# Patient Record
Sex: Male | Born: 1970 | Race: White | Hispanic: No | Marital: Single | Smoking: Current every day smoker
Health system: Southern US, Community
[De-identification: ages and names within clinical notes are randomized; demographics above are authoritative.]

## PROBLEM LIST (undated history)

## (undated) DIAGNOSIS — I639 Cerebral infarction, unspecified: Secondary | ICD-10-CM

## (undated) DIAGNOSIS — Z5900 Homelessness unspecified: Secondary | ICD-10-CM

---

## 2021-05-03 ENCOUNTER — Other Ambulatory Visit: Payer: Self-pay

## 2021-05-03 ENCOUNTER — Emergency Department (HOSPITAL_COMMUNITY): Payer: Medicare Other

## 2021-05-03 ENCOUNTER — Emergency Department (HOSPITAL_COMMUNITY)
Admission: EM | Admit: 2021-05-03 | Discharge: 2021-05-03 | Disposition: A | Discharge status: Home / Self Care | Payer: Medicare Other | Attending: Emergency Medicine | Admitting: Emergency Medicine

## 2021-05-03 ENCOUNTER — Encounter (HOSPITAL_COMMUNITY): Payer: Self-pay | Admitting: Emergency Medicine

## 2021-05-03 DIAGNOSIS — S80212A Abrasion, left knee, initial encounter: Secondary | ICD-10-CM | POA: Diagnosis not present

## 2021-05-03 DIAGNOSIS — S60512A Abrasion of left hand, initial encounter: Secondary | ICD-10-CM | POA: Diagnosis not present

## 2021-05-03 DIAGNOSIS — X58XXXA Exposure to other specified factors, initial encounter: Secondary | ICD-10-CM | POA: Diagnosis not present

## 2021-05-03 DIAGNOSIS — I10 Essential (primary) hypertension: Secondary | ICD-10-CM | POA: Diagnosis not present

## 2021-05-03 DIAGNOSIS — Z23 Encounter for immunization: Secondary | ICD-10-CM | POA: Insufficient documentation

## 2021-05-03 DIAGNOSIS — S80211A Abrasion, right knee, initial encounter: Secondary | ICD-10-CM | POA: Insufficient documentation

## 2021-05-03 DIAGNOSIS — R569 Unspecified convulsions: Secondary | ICD-10-CM | POA: Diagnosis not present

## 2021-05-03 DIAGNOSIS — S0181XA Laceration without foreign body of other part of head, initial encounter: Secondary | ICD-10-CM | POA: Diagnosis not present

## 2021-05-03 DIAGNOSIS — S0990XA Unspecified injury of head, initial encounter: Secondary | ICD-10-CM | POA: Diagnosis present

## 2021-05-03 HISTORY — DX: Cerebral infarction, unspecified: I63.9

## 2021-05-03 LAB — CBC WITH DIFFERENTIAL/PLATELET
Abs Immature Granulocytes: 0.06 10*3/uL (ref 0.00–0.07)
Basophils Absolute: 0 10*3/uL (ref 0.0–0.1)
Basophils Relative: 0 %
Eosinophils Absolute: 0 10*3/uL (ref 0.0–0.5)
Eosinophils Relative: 0 %
HCT: 43.5 % (ref 39.0–52.0)
Hemoglobin: 15 g/dL (ref 13.0–17.0)
Immature Granulocytes: 1 %
Lymphocytes Relative: 9 %
Lymphs Abs: 1 10*3/uL (ref 0.7–4.0)
MCH: 29.8 pg (ref 26.0–34.0)
MCHC: 34.5 g/dL (ref 30.0–36.0)
MCV: 86.3 fL (ref 80.0–100.0)
Monocytes Absolute: 0.8 10*3/uL (ref 0.1–1.0)
Monocytes Relative: 7 %
Neutro Abs: 9.8 10*3/uL — ABNORMAL HIGH (ref 1.7–7.7)
Neutrophils Relative %: 83 %
Platelets: 240 10*3/uL (ref 150–400)
RBC: 5.04 MIL/uL (ref 4.22–5.81)
RDW: 12.8 % (ref 11.5–15.5)
WBC: 11.7 10*3/uL — ABNORMAL HIGH (ref 4.0–10.5)
nRBC: 0 % (ref 0.0–0.2)

## 2021-05-03 LAB — COMPREHENSIVE METABOLIC PANEL
ALT: 24 U/L (ref 0–44)
AST: 33 U/L (ref 15–41)
Albumin: 4.4 g/dL (ref 3.5–5.0)
Alkaline Phosphatase: 87 U/L (ref 38–126)
Anion gap: 17 — ABNORMAL HIGH (ref 5–15)
BUN: 24 mg/dL — ABNORMAL HIGH (ref 6–20)
CO2: 17 mmol/L — ABNORMAL LOW (ref 22–32)
Calcium: 9.4 mg/dL (ref 8.9–10.3)
Chloride: 104 mmol/L (ref 98–111)
Creatinine, Ser: 1.31 mg/dL — ABNORMAL HIGH (ref 0.61–1.24)
GFR, Estimated: >60 mL/min (ref >60)
Glucose, Bld: 65 mg/dL — ABNORMAL LOW (ref 70–99)
Potassium: 3.6 mmol/L (ref 3.5–5.1)
Sodium: 138 mmol/L (ref 135–145)
Total Bilirubin: 1.8 mg/dL — ABNORMAL HIGH (ref 0.3–1.2)
Total Protein: 7.8 g/dL (ref 6.5–8.1)

## 2021-05-03 LAB — RAPID URINE DRUG SCREEN, HOSP PERFORMED
Amphetamines: NOT DETECTED
Barbiturates: NOT DETECTED
Benzodiazepines: NOT DETECTED
Cocaine: NOT DETECTED
Opiates: NOT DETECTED
Tetrahydrocannabinol: NOT DETECTED

## 2021-05-03 LAB — CBG MONITORING, ED
Glucose-Capillary: 71 mg/dL (ref 70–99)
Glucose-Capillary: 72 mg/dL (ref 70–99)

## 2021-05-03 MED ORDER — DIVALPROEX SODIUM 500 MG PO DR TAB: 500.0000 mg | DELAYED_RELEASE_TABLET | Freq: Two times a day (BID) | ORAL | 2 refills | Status: DC

## 2021-05-03 MED ORDER — LACTATED RINGERS IV BOLUS
1000.0000 mL | Freq: Once | INTRAVENOUS | Status: AC
  Administered 2021-05-03: 1000 mL via INTRAVENOUS

## 2021-05-03 MED ORDER — TETANUS-DIPHTH-ACELL PERTUSSIS 5-2.5-18.5 LF-MCG/0.5 IM SUSY
0.5000 mL | PREFILLED_SYRINGE | Freq: Once | INTRAMUSCULAR | Status: AC
  Administered 2021-05-03: 0.5 mL via INTRAMUSCULAR
  Filled 2021-05-03: qty 0.5

## 2021-05-03 MED ORDER — VALPROATE SODIUM 100 MG/ML IV SOLN: 1000.0000 mg | Freq: Two times a day (BID) | INTRAVENOUS | Status: DC

## 2021-05-03 MED ORDER — DIVALPROEX SODIUM 500 MG PO DR TAB
1000.0000 mg | DELAYED_RELEASE_TABLET | Freq: Once | ORAL | Status: AC
  Administered 2021-05-03: 1000 mg via ORAL
  Filled 2021-05-03: qty 2

## 2021-05-03 NOTE — ED Notes (Signed)
Patient returned back from CT at this time.  °

## 2021-05-03 NOTE — Discharge Instructions (Addendum)
You were seen in the emergency department after having a seizure.  Your work-up in the emergency department showed that you had some low blood sugars, so we allowed you to eat while you were in the emergency department.  We also gave you some more Depakote while you were here as well as fluids and a tetanus shot.  I have prescribed you Depakote, you will take 500 mg twice a day.  I have given you a physical copy of this prescription which she will bring to [facility] wellness.  Their address and contact information is in this packet.  Please return to the ED for any worsening symptoms.

## 2021-05-03 NOTE — ED Provider Notes (Signed)
I saw and evaluated the patient, reviewed the resident's note and I agree with the findings and plan.  50 year old male presents after being found on the ground reportedly have had multiple seizures.  Patient states Depakote has been noncompliant.  Denies any use of alcohol tobacco.  Does have some facial injuries from this.  His neurological exam shows no focal weakness.  Does have a facial abrasion noted.  Will CT head and face.  Check electrolytes and loaded with Depakote   Lorre Nick, MD 05/03/21 [id number]

## 2021-05-03 NOTE — ED Triage Notes (Signed)
Pt BIB EMS, found lying on sidewalk with cold exposure. Reported with multiple seizure activity. Pt states he has seizures three times a day. He is on Depakote but does not have access to medications. Skin tear noted to left eye and knuckle. Denies pain, dizziness, N/V. A&O x4  BP 210/120 HR 90 SpO2 98% RA CBG 70

## 2021-05-03 NOTE — ED Provider Notes (Signed)
[facility]-EMERGENCY DEPT Provider Note   CSN: [id number] Arrival date & time: 05/03/21  0848    History Chief Complaint  Patient presents with   Seizures   Hypertension    Arthur [name] is a 50 y.o. male who presents to the ED today with complaints of seizure and hypertension.  Patient is a somewhat poor historian.  He states that he had a seizure last night in his trailer.  He also reports that "someone from school" found him and called EMS.  He does not remember much from the event and has difficulty recalling details.  He states that he has had seizures for the past few years and was originally put on Depakote.  He has not been on Depakote for the past 1.5 years.  Denies alcohol or other drug use.  Endorses a personal history of a stroke.  No family history endorsed by the patient.    Past Medical History:  Diagnosis Date   Stroke [facility])     There are no problems to display for this patient.   History reviewed. No pertinent surgical history.     History reviewed. No pertinent family history.     Home Medications Prior to Admission medications   Medication Sig Start Date End Date Taking? Authorizing Provider  divalproex (DEPAKOTE) 500 MG DR tablet Take 1 tablet (500 mg total) by mouth 2 (two) times daily. 05/03/21  Yes Andrey Campanile, MD    Allergies    Patient has no known allergies.  Review of Systems   Review of Systems  Constitutional: Negative.   HENT: Negative.    Eyes: Negative.   Respiratory:  Negative for shortness of breath.   Cardiovascular:  Negative for chest pain.  Gastrointestinal: Negative.   Endocrine: Negative.   Genitourinary: Negative.   Musculoskeletal: Negative.   Skin:        Lacerations to the face, left knuckles, bilateral knees  Allergic/Immunologic: Negative.   Neurological:  Positive for seizures.  Hematological: Negative.   Psychiatric/Behavioral: Negative.     Physical Exam Updated Vital  Signs BP (!) 181/107   Pulse 80   Temp 98 F (36.7 C) (Oral)   Resp 20   Ht 6\' 4"  (1.93 m)   Wt 90.7 kg   SpO2 99%   BMI 24.34 kg/m   Physical Exam HENT:     Head: Normocephalic.     Comments: There is a 1 x 1 cm laceration in the left temporal region with overlying dried blood.     Mouth/Throat:     Comments: No tongue lacerations noted, though pt with difficulty following some commands Eyes:     Extraocular Movements: Extraocular movements intact.     Pupils: Pupils are equal, round, and reactive to light.  Cardiovascular:     Rate and Rhythm: Normal rate and regular rhythm.     Heart sounds: No murmur heard.   No friction rub. No gallop.  Pulmonary:     Effort: Pulmonary effort is normal.     Breath sounds: Normal breath sounds. No wheezing, rhonchi or rales.  Abdominal:     General: Abdomen is flat. There is no distension.  Skin:    General: Skin is warm and dry.     Comments: There are several abrasions over the anterior aspect of the patella bilaterally without signs of active bleeding. There are small abrasions over the dorsal aspect of the left hand without signs of bleeding  Neurological:     Mental  Status: He is alert and oriented to person, place, and time.     Comments: There is decreased sensation of V2 and V3 regions on the left, right side is intact. 4/5 strength in LUE. 5/5 strength in RUE. 5/5 strength in BLE.  Psychiatric:        Mood and Affect: Mood normal.        Behavior: Behavior normal.    ED Results / Procedures / Treatments   Labs (all labs ordered are listed, but only abnormal results are displayed) Labs Reviewed  CBC WITH DIFFERENTIAL/PLATELET - Abnormal; Notable for the following components:      Result Value   WBC 11.7 (*)    Neutro Abs 9.8 (*)    All other components within normal limits  COMPREHENSIVE METABOLIC PANEL - Abnormal; Notable for the following components:   CO2 17 (*)    Glucose, Bld 65 (*)    BUN 24 (*)    Creatinine,  Ser 1.31 (*)    Total Bilirubin 1.8 (*)    Anion gap 17 (*)    All other components within normal limits  RAPID URINE DRUG SCREEN, HOSP PERFORMED  CBG MONITORING, ED  CBG MONITORING, ED    EKG None  Radiology CT Head Wo Contrast  Result Date: 05/03/2021 CLINICAL DATA:  Found lying on sidewalk, history of seizures EXAM: CT HEAD WITHOUT CONTRAST CT MAXILLOFACIAL WITHOUT CONTRAST CT CERVICAL SPINE WITHOUT CONTRAST TECHNIQUE: Multidetector CT imaging of the head, cervical spine, and maxillofacial structures were performed using the standard protocol without intravenous contrast. Multiplanar CT image reconstructions of the cervical spine and maxillofacial structures were also generated. COMPARISON:  None. FINDINGS: CT HEAD FINDINGS Brain: No definite evidence of acute infarction, hemorrhage, hydrocephalus, extra-axial collection or mass lesion/mass effect. There are multiple lacunes and hypodensities throughout the basal ganglia and frontal hemispheric deep white matter (series 3, image 20, 17). Vascular: No hyperdense vessel or unexpected calcification. CT FACIAL BONES FINDINGS Skull: Normal. Negative for fracture or focal lesion. Facial bones: Minimally displaced fracture of the left nasal bone (series 8, image 35). No other displaced fractures or dislocations. Sinuses/Orbits: No acute finding. Other: Soft tissue contusion of the left cheek (series 7, image 32) and chin (series 7, image 63). CT CERVICAL SPINE FINDINGS Alignment: Degenerative straightening of the normal cervical lordosis. Skull base and vertebrae: No acute fracture. No primary bone lesion or focal pathologic process. Soft tissues and spinal canal: No prevertebral fluid or swelling. No visible canal hematoma. Disc levels: Moderate disc space height loss and osteophytosis of C5 through C7 with otherwise preserved disc spaces. Upper chest: Negative. Other: None. IMPRESSION: 1. No definite acute intracranial pathology. 2. There are multiple  lacunes and hypodensities throughout the basal ganglia and frontal hemispheric deep white matter. These are consistent with small infarctions, demyelinating disorder perhaps a general differential consideration. MRI may be used to further evaluate if desired. 3. Minimally displaced fracture of the left nasal bone. No other displaced fractures or dislocations of the facial bones. 4. Soft tissue contusion of the left cheek and chin. 5. No fracture or static subluxation of the cervical spine. 6. Moderate disc degenerative disease of C5 through C7 otherwise preserved disc spaces. Electronically Signed   By: Jearld Lesch M.D.   On: 05/03/2021 11:08   CT Cervical Spine Wo Contrast  Result Date: 05/03/2021 CLINICAL DATA:  Found lying on sidewalk, history of seizures EXAM: CT HEAD WITHOUT CONTRAST CT MAXILLOFACIAL WITHOUT CONTRAST CT CERVICAL SPINE WITHOUT CONTRAST TECHNIQUE: Multidetector CT  imaging of the head, cervical spine, and maxillofacial structures were performed using the standard protocol without intravenous contrast. Multiplanar CT image reconstructions of the cervical spine and maxillofacial structures were also generated. COMPARISON:  None. FINDINGS: CT HEAD FINDINGS Brain: No definite evidence of acute infarction, hemorrhage, hydrocephalus, extra-axial collection or mass lesion/mass effect. There are multiple lacunes and hypodensities throughout the basal ganglia and frontal hemispheric deep white matter (series 3, image 20, 17). Vascular: No hyperdense vessel or unexpected calcification. CT FACIAL BONES FINDINGS Skull: Normal. Negative for fracture or focal lesion. Facial bones: Minimally displaced fracture of the left nasal bone (series 8, image 35). No other displaced fractures or dislocations. Sinuses/Orbits: No acute finding. Other: Soft tissue contusion of the left cheek (series 7, image 32) and chin (series 7, image 63). CT CERVICAL SPINE FINDINGS Alignment: Degenerative straightening of the  normal cervical lordosis. Skull base and vertebrae: No acute fracture. No primary bone lesion or focal pathologic process. Soft tissues and spinal canal: No prevertebral fluid or swelling. No visible canal hematoma. Disc levels: Moderate disc space height loss and osteophytosis of C5 through C7 with otherwise preserved disc spaces. Upper chest: Negative. Other: None. IMPRESSION: 1. No definite acute intracranial pathology. 2. There are multiple lacunes and hypodensities throughout the basal ganglia and frontal hemispheric deep white matter. These are consistent with small infarctions, demyelinating disorder perhaps a general differential consideration. MRI may be used to further evaluate if desired. 3. Minimally displaced fracture of the left nasal bone. No other displaced fractures or dislocations of the facial bones. 4. Soft tissue contusion of the left cheek and chin. 5. No fracture or static subluxation of the cervical spine. 6. Moderate disc degenerative disease of C5 through C7 otherwise preserved disc spaces. Electronically Signed   By: Jearld Lesch M.D.   On: 05/03/2021 11:08   CT Maxillofacial Wo Contrast  Result Date: 05/03/2021 CLINICAL DATA:  Found lying on sidewalk, history of seizures EXAM: CT HEAD WITHOUT CONTRAST CT MAXILLOFACIAL WITHOUT CONTRAST CT CERVICAL SPINE WITHOUT CONTRAST TECHNIQUE: Multidetector CT imaging of the head, cervical spine, and maxillofacial structures were performed using the standard protocol without intravenous contrast. Multiplanar CT image reconstructions of the cervical spine and maxillofacial structures were also generated. COMPARISON:  None. FINDINGS: CT HEAD FINDINGS Brain: No definite evidence of acute infarction, hemorrhage, hydrocephalus, extra-axial collection or mass lesion/mass effect. There are multiple lacunes and hypodensities throughout the basal ganglia and frontal hemispheric deep white matter (series 3, image 20, 17). Vascular: No hyperdense vessel or  unexpected calcification. CT FACIAL BONES FINDINGS Skull: Normal. Negative for fracture or focal lesion. Facial bones: Minimally displaced fracture of the left nasal bone (series 8, image 35). No other displaced fractures or dislocations. Sinuses/Orbits: No acute finding. Other: Soft tissue contusion of the left cheek (series 7, image 32) and chin (series 7, image 63). CT CERVICAL SPINE FINDINGS Alignment: Degenerative straightening of the normal cervical lordosis. Skull base and vertebrae: No acute fracture. No primary bone lesion or focal pathologic process. Soft tissues and spinal canal: No prevertebral fluid or swelling. No visible canal hematoma. Disc levels: Moderate disc space height loss and osteophytosis of C5 through C7 with otherwise preserved disc spaces. Upper chest: Negative. Other: None. IMPRESSION: 1. No definite acute intracranial pathology. 2. There are multiple lacunes and hypodensities throughout the basal ganglia and frontal hemispheric deep white matter. These are consistent with small infarctions, demyelinating disorder perhaps a general differential consideration. MRI may be used to further evaluate if desired. 3. Minimally displaced  fracture of the left nasal bone. No other displaced fractures or dislocations of the facial bones. 4. Soft tissue contusion of the left cheek and chin. 5. No fracture or static subluxation of the cervical spine. 6. Moderate disc degenerative disease of C5 through C7 otherwise preserved disc spaces. Electronically Signed   By: Jearld Lesch M.D.   On: 05/03/2021 11:08    Procedures Procedures   Medications Ordered in ED Medications  Tdap (BOOSTRIX) injection 0.5 mL (has no administration in time range)  divalproex (DEPAKOTE) DR tablet 1,000 mg (1,000 mg Oral Given 05/03/21 1006)  lactated ringers bolus 1,000 mL (1,000 mLs Intravenous New Bag/Given 05/03/21 1049)    ED Course  I have reviewed the triage vital signs and the nursing notes.  Pertinent  labs & imaging results that were available during my care of the patient were reviewed by me and considered in my medical decision making (see chart for details).    MDM Rules/Calculators/A&P                         This is a 50 yo M with a past medical history of seizures who is here today after having an event last night in the setting of difficulty accessing his Depakote regimen.  On arrival, pt was afebrile with BP of 207/128. CBG 71 on arrival. Exam is notable for a facial laceration in the temporal region, decreased sensation of V2 and V3 on the left side and 4/5 strength in LUE, though patient states that he has had these neuro findings since he had a stroke 1 year ago. We will obtain labs and CT imaging for workup of possible seizures, differential includes epilepsy, substance-induced, electrolyte derangement, intra-cranial lesion.   10 AM: Ordered CBC, CMP, urine tox, CT head and maxillofacial w/o contrast. Administered 1 g PO depakote.   11 AM: UDS negative, CMP showing glucose of 65. WBC 11.7 but otherwise unremarkable.  CT head, C-spine, maxillofacial show no evidence of acute intracranial pathology but reveal multiple committees and hypodensities throughout the basal ganglia and deep white matter.  With the exception of minimally displaced fracture of left nasal bone, no other fractures or abnormalities.  While in the ED, we have allowed the patient to eat given low glucose levels.  Lacerations were cleaned and dressed, and the patient was given fluid bolus and tetanus shot.  For his seizures, we have prescribed Depakote 500 mg twice a day.  We have placed a referral for community health and wellness for the patient to obtain his prescription.  Patient was stable at discharge.  Final Clinical Impression(s) / ED Diagnoses Final diagnoses:  Seizure [facility])    Rx / DC Orders ED Discharge Orders          Ordered    divalproex (DEPAKOTE) 500 MG DR tablet  2 times daily        05/03/21  1336             Andrey Campanile, MD 05/03/21 1355    Lorre Nick, MD 05/05/21 1120

## 2021-05-03 NOTE — ED Notes (Signed)
Patient transported to CT 

## 2021-05-04 ENCOUNTER — Other Ambulatory Visit: Payer: Self-pay

## 2021-05-04 ENCOUNTER — Emergency Department (HOSPITAL_COMMUNITY): Payer: Medicare Other

## 2021-05-04 ENCOUNTER — Encounter (HOSPITAL_COMMUNITY): Payer: Self-pay | Admitting: Emergency Medicine

## 2021-05-04 ENCOUNTER — Emergency Department (HOSPITAL_COMMUNITY)
Admission: EM | Admit: 2021-05-04 | Discharge: 2021-05-04 | Disposition: A | Discharge status: Home / Self Care | Payer: Medicare Other | Source: Home / Self Care | Attending: Emergency Medicine | Admitting: Emergency Medicine

## 2021-05-04 DIAGNOSIS — S00212A Abrasion of left eyelid and periocular area, initial encounter: Secondary | ICD-10-CM | POA: Insufficient documentation

## 2021-05-04 DIAGNOSIS — M25512 Pain in left shoulder: Secondary | ICD-10-CM | POA: Insufficient documentation

## 2021-05-04 DIAGNOSIS — X58XXXA Exposure to other specified factors, initial encounter: Secondary | ICD-10-CM | POA: Insufficient documentation

## 2021-05-04 DIAGNOSIS — S60512A Abrasion of left hand, initial encounter: Secondary | ICD-10-CM | POA: Insufficient documentation

## 2021-05-04 DIAGNOSIS — R569 Unspecified convulsions: Secondary | ICD-10-CM

## 2021-05-04 DIAGNOSIS — R7309 Other abnormal glucose: Secondary | ICD-10-CM | POA: Insufficient documentation

## 2021-05-04 LAB — COMPREHENSIVE METABOLIC PANEL
ALT: 24 U/L (ref 0–44)
AST: 34 U/L (ref 15–41)
Albumin: 4 g/dL (ref 3.5–5.0)
Alkaline Phosphatase: 72 U/L (ref 38–126)
Anion gap: 13 (ref 5–15)
BUN: 25 mg/dL — ABNORMAL HIGH (ref 6–20)
CO2: 18 mmol/L — ABNORMAL LOW (ref 22–32)
Calcium: 9.2 mg/dL (ref 8.9–10.3)
Chloride: 105 mmol/L (ref 98–111)
Creatinine, Ser: 1.25 mg/dL — ABNORMAL HIGH (ref 0.61–1.24)
GFR, Estimated: >60 mL/min (ref >60)
Glucose, Bld: 83 mg/dL (ref 70–99)
Potassium: 3.5 mmol/L (ref 3.5–5.1)
Sodium: 136 mmol/L (ref 135–145)
Total Bilirubin: 1.6 mg/dL — ABNORMAL HIGH (ref 0.3–1.2)
Total Protein: 6.8 g/dL (ref 6.5–8.1)

## 2021-05-04 LAB — CBC WITH DIFFERENTIAL/PLATELET
Abs Immature Granulocytes: 0.03 10*3/uL (ref 0.00–0.07)
Basophils Absolute: 0 10*3/uL (ref 0.0–0.1)
Basophils Relative: 1 %
Eosinophils Absolute: 0 10*3/uL (ref 0.0–0.5)
Eosinophils Relative: 1 %
HCT: 39.2 % (ref 39.0–52.0)
Hemoglobin: 13.6 g/dL (ref 13.0–17.0)
Immature Granulocytes: 0 %
Lymphocytes Relative: 23 %
Lymphs Abs: 1.8 10*3/uL (ref 0.7–4.0)
MCH: 29.8 pg (ref 26.0–34.0)
MCHC: 34.7 g/dL (ref 30.0–36.0)
MCV: 85.8 fL (ref 80.0–100.0)
Monocytes Absolute: 0.9 10*3/uL (ref 0.1–1.0)
Monocytes Relative: 12 %
Neutro Abs: 5.2 10*3/uL (ref 1.7–7.7)
Neutrophils Relative %: 63 %
Platelets: 212 10*3/uL (ref 150–400)
RBC: 4.57 MIL/uL (ref 4.22–5.81)
RDW: 12.7 % (ref 11.5–15.5)
WBC: 8.1 10*3/uL (ref 4.0–10.5)
nRBC: 0 % (ref 0.0–0.2)

## 2021-05-04 LAB — URINALYSIS, ROUTINE W REFLEX MICROSCOPIC
Bacteria, UA: NONE SEEN
Glucose, UA: NEGATIVE mg/dL
Ketones, ur: >=80 mg/dL — AB
Leukocytes,Ua: NEGATIVE
Nitrite: NEGATIVE
Specific Gravity, Urine: >=1.03 (ref 1.005–1.030)
pH: 6 (ref 5.0–8.0)

## 2021-05-04 LAB — BLOOD GAS, VENOUS
Acid-base deficit: 5.7 mmol/L — ABNORMAL HIGH (ref 0.0–2.0)
Bicarbonate: 18.9 mmol/L — ABNORMAL LOW (ref 20.0–28.0)
FIO2: 21
O2 Saturation: 70.1 %
Patient temperature: 98.6
pCO2, Ven: 36.2 mmHg — ABNORMAL LOW (ref 44.0–60.0)
pH, Ven: 7.338 (ref 7.250–7.430)
pO2, Ven: 40.3 mmHg (ref 32.0–45.0)

## 2021-05-04 LAB — RAPID URINE DRUG SCREEN, HOSP PERFORMED
Amphetamines: NOT DETECTED
Barbiturates: NOT DETECTED
Benzodiazepines: NOT DETECTED
Cocaine: NOT DETECTED
Opiates: NOT DETECTED
Tetrahydrocannabinol: NOT DETECTED

## 2021-05-04 LAB — ETHANOL: Alcohol, Ethyl (B): <10 mg/dL (ref <10)

## 2021-05-04 LAB — VALPROIC ACID LEVEL: Valproic Acid Lvl: 15 ug/mL — ABNORMAL LOW (ref 50.0–100.0)

## 2021-05-04 LAB — CBG MONITORING, ED: Glucose-Capillary: 84 mg/dL (ref 70–99)

## 2021-05-04 MED ORDER — DIVALPROEX SODIUM 500 MG PO DR TAB
500.0000 mg | DELAYED_RELEASE_TABLET | Freq: Once | ORAL | Status: AC
  Administered 2021-05-04: 500 mg via ORAL
  Filled 2021-05-04: qty 1

## 2021-05-04 MED ORDER — DIVALPROEX SODIUM 500 MG PO DR TAB
500.0000 mg | DELAYED_RELEASE_TABLET | Freq: Two times a day (BID) | ORAL | 2 refills | Status: DC
  Filled 2021-05-04: qty 60, 30d supply, fill #0

## 2021-05-04 NOTE — ED Triage Notes (Signed)
Pt BIBA.  Per EMS pt was found in the parking lot of the hospital. EMS reports a bystander called 911 reporting the patient laying in the rain. When EMS got to the scene, fire and [employer] were already on the scene reporting a possible seizure. A&O x4. Endorses left shoulder pain.

## 2021-05-04 NOTE — ED Provider Notes (Signed)
[facility]-EMERGENCY DEPT Provider Note   CSN: [id number] Arrival date & time: 05/04/21  1243     History Chief Complaint  Patient presents with   Seizures    Travares Nelles is a 50 y.o. male.  HPI 50 year old male presents after possible seizure.  History is pretty limited at first as the patient seems to be sleepy and unable to communicate much of a history.  He was apparently endorsing left shoulder pain.  Apparently people found him lying in the rain and EMS got there and they were reporting a possible seizure.  The patient was here yesterday for seizures.  He has a known history of seizures but it does not seem like he is compliant with his meds.  Patient does not know what happened or why he is in the hospital.  He denies current shoulder pain.  Past Medical History:  Diagnosis Date   Stroke [facility])     There are no problems to display for this patient.   History reviewed. No pertinent surgical history.     No family history on file.     Home Medications Prior to Admission medications   Medication Sig Start Date End Date Taking? Authorizing Provider  divalproex (DEPAKOTE) 500 MG DR tablet Take 1 tablet (500 mg total) by mouth 2 (two) times daily. 05/03/21   Andrey Campanile, MD    Allergies    Patient has no known allergies.  Review of Systems   Review of Systems  Constitutional:  Negative for fever.  Cardiovascular:  Negative for chest pain.  Musculoskeletal:  Negative for arthralgias.  Neurological:  Negative for headaches.  All other systems reviewed and are negative.  Physical Exam Updated Vital Signs BP (!) 179/110   Pulse 85   Temp 98 F (36.7 C) (Oral)   Resp 15   Ht 6\' 4"  (1.93 m)   Wt 90.7 kg   SpO2 95%   BMI 24.34 kg/m   Physical Exam Vitals and nursing note reviewed.  Constitutional:      General: He is not in acute distress.    Appearance: He is well-developed. He is not ill-appearing or diaphoretic.   HENT:     Head: Normocephalic.     Comments: Left periorbital abrasion.    Right Ear: External ear normal.     Left Ear: External ear normal.     Nose: Nose normal.  Eyes:     General:        Right eye: No discharge.        Left eye: No discharge.     Pupils: Pupils are equal, round, and reactive to light.  Cardiovascular:     Rate and Rhythm: Normal rate and regular rhythm.     Heart sounds: Normal heart sounds.  Pulmonary:     Effort: Pulmonary effort is normal.     Breath sounds: Normal breath sounds.  Abdominal:     Palpations: Abdomen is soft.     Tenderness: There is no abdominal tenderness.  Musculoskeletal:     Cervical back: Normal range of motion and neck supple.  Skin:    General: Skin is warm and dry.     Comments: Left hand abrasions  Neurological:     Mental Status: He is alert.     Comments: Awake, alert, and now oriented.  Initially he was lethargic but now is awake and alert.  He has equal strength in all 4 extremities.  Psychiatric:  Mood and Affect: Mood is not anxious.    ED Results / Procedures / Treatments   Labs (all labs ordered are listed, but only abnormal results are displayed) Labs Reviewed  CBC WITH DIFFERENTIAL/PLATELET  VALPROIC ACID LEVEL  COMPREHENSIVE METABOLIC PANEL  ETHANOL  URINALYSIS, ROUTINE W REFLEX MICROSCOPIC  RAPID URINE DRUG SCREEN, HOSP PERFORMED  BLOOD GAS, VENOUS  CBG MONITORING, ED    EKG None  Radiology CT Head Wo Contrast  Result Date: 05/03/2021 CLINICAL DATA:  Found lying on sidewalk, history of seizures EXAM: CT HEAD WITHOUT CONTRAST CT MAXILLOFACIAL WITHOUT CONTRAST CT CERVICAL SPINE WITHOUT CONTRAST TECHNIQUE: Multidetector CT imaging of the head, cervical spine, and maxillofacial structures were performed using the standard protocol without intravenous contrast. Multiplanar CT image reconstructions of the cervical spine and maxillofacial structures were also generated. COMPARISON:  None. FINDINGS: CT  HEAD FINDINGS Brain: No definite evidence of acute infarction, hemorrhage, hydrocephalus, extra-axial collection or mass lesion/mass effect. There are multiple lacunes and hypodensities throughout the basal ganglia and frontal hemispheric deep white matter (series 3, image 20, 17). Vascular: No hyperdense vessel or unexpected calcification. CT FACIAL BONES FINDINGS Skull: Normal. Negative for fracture or focal lesion. Facial bones: Minimally displaced fracture of the left nasal bone (series 8, image 35). No other displaced fractures or dislocations. Sinuses/Orbits: No acute finding. Other: Soft tissue contusion of the left cheek (series 7, image 32) and chin (series 7, image 63). CT CERVICAL SPINE FINDINGS Alignment: Degenerative straightening of the normal cervical lordosis. Skull base and vertebrae: No acute fracture. No primary bone lesion or focal pathologic process. Soft tissues and spinal canal: No prevertebral fluid or swelling. No visible canal hematoma. Disc levels: Moderate disc space height loss and osteophytosis of C5 through C7 with otherwise preserved disc spaces. Upper chest: Negative. Other: None. IMPRESSION: 1. No definite acute intracranial pathology. 2. There are multiple lacunes and hypodensities throughout the basal ganglia and frontal hemispheric deep white matter. These are consistent with small infarctions, demyelinating disorder perhaps a general differential consideration. MRI may be used to further evaluate if desired. 3. Minimally displaced fracture of the left nasal bone. No other displaced fractures or dislocations of the facial bones. 4. Soft tissue contusion of the left cheek and chin. 5. No fracture or static subluxation of the cervical spine. 6. Moderate disc degenerative disease of C5 through C7 otherwise preserved disc spaces. Electronically Signed   By: Jearld Lesch M.D.   On: 05/03/2021 11:08   CT Cervical Spine Wo Contrast  Result Date: 05/03/2021 CLINICAL DATA:  Found  lying on sidewalk, history of seizures EXAM: CT HEAD WITHOUT CONTRAST CT MAXILLOFACIAL WITHOUT CONTRAST CT CERVICAL SPINE WITHOUT CONTRAST TECHNIQUE: Multidetector CT imaging of the head, cervical spine, and maxillofacial structures were performed using the standard protocol without intravenous contrast. Multiplanar CT image reconstructions of the cervical spine and maxillofacial structures were also generated. COMPARISON:  None. FINDINGS: CT HEAD FINDINGS Brain: No definite evidence of acute infarction, hemorrhage, hydrocephalus, extra-axial collection or mass lesion/mass effect. There are multiple lacunes and hypodensities throughout the basal ganglia and frontal hemispheric deep white matter (series 3, image 20, 17). Vascular: No hyperdense vessel or unexpected calcification. CT FACIAL BONES FINDINGS Skull: Normal. Negative for fracture or focal lesion. Facial bones: Minimally displaced fracture of the left nasal bone (series 8, image 35). No other displaced fractures or dislocations. Sinuses/Orbits: No acute finding. Other: Soft tissue contusion of the left cheek (series 7, image 32) and chin (series 7, image 63). CT CERVICAL SPINE  FINDINGS Alignment: Degenerative straightening of the normal cervical lordosis. Skull base and vertebrae: No acute fracture. No primary bone lesion or focal pathologic process. Soft tissues and spinal canal: No prevertebral fluid or swelling. No visible canal hematoma. Disc levels: Moderate disc space height loss and osteophytosis of C5 through C7 with otherwise preserved disc spaces. Upper chest: Negative. Other: None. IMPRESSION: 1. No definite acute intracranial pathology. 2. There are multiple lacunes and hypodensities throughout the basal ganglia and frontal hemispheric deep white matter. These are consistent with small infarctions, demyelinating disorder perhaps a general differential consideration. MRI may be used to further evaluate if desired. 3. Minimally displaced fracture  of the left nasal bone. No other displaced fractures or dislocations of the facial bones. 4. Soft tissue contusion of the left cheek and chin. 5. No fracture or static subluxation of the cervical spine. 6. Moderate disc degenerative disease of C5 through C7 otherwise preserved disc spaces. Electronically Signed   By: Jearld Lesch M.D.   On: 05/03/2021 11:08   CT Maxillofacial Wo Contrast  Result Date: 05/03/2021 CLINICAL DATA:  Found lying on sidewalk, history of seizures EXAM: CT HEAD WITHOUT CONTRAST CT MAXILLOFACIAL WITHOUT CONTRAST CT CERVICAL SPINE WITHOUT CONTRAST TECHNIQUE: Multidetector CT imaging of the head, cervical spine, and maxillofacial structures were performed using the standard protocol without intravenous contrast. Multiplanar CT image reconstructions of the cervical spine and maxillofacial structures were also generated. COMPARISON:  None. FINDINGS: CT HEAD FINDINGS Brain: No definite evidence of acute infarction, hemorrhage, hydrocephalus, extra-axial collection or mass lesion/mass effect. There are multiple lacunes and hypodensities throughout the basal ganglia and frontal hemispheric deep white matter (series 3, image 20, 17). Vascular: No hyperdense vessel or unexpected calcification. CT FACIAL BONES FINDINGS Skull: Normal. Negative for fracture or focal lesion. Facial bones: Minimally displaced fracture of the left nasal bone (series 8, image 35). No other displaced fractures or dislocations. Sinuses/Orbits: No acute finding. Other: Soft tissue contusion of the left cheek (series 7, image 32) and chin (series 7, image 63). CT CERVICAL SPINE FINDINGS Alignment: Degenerative straightening of the normal cervical lordosis. Skull base and vertebrae: No acute fracture. No primary bone lesion or focal pathologic process. Soft tissues and spinal canal: No prevertebral fluid or swelling. No visible canal hematoma. Disc levels: Moderate disc space height loss and osteophytosis of C5 through C7  with otherwise preserved disc spaces. Upper chest: Negative. Other: None. IMPRESSION: 1. No definite acute intracranial pathology. 2. There are multiple lacunes and hypodensities throughout the basal ganglia and frontal hemispheric deep white matter. These are consistent with small infarctions, demyelinating disorder perhaps a general differential consideration. MRI may be used to further evaluate if desired. 3. Minimally displaced fracture of the left nasal bone. No other displaced fractures or dislocations of the facial bones. 4. Soft tissue contusion of the left cheek and chin. 5. No fracture or static subluxation of the cervical spine. 6. Moderate disc degenerative disease of C5 through C7 otherwise preserved disc spaces. Electronically Signed   By: Jearld Lesch M.D.   On: 05/03/2021 11:08    Procedures Procedures   Medications Ordered in ED Medications - No data to display  ED Course  I have reviewed the triage vital signs and the nursing notes.  Pertinent labs & imaging results that were available during my care of the patient were reviewed by me and considered in my medical decision making (see chart for details).    MDM Rules/Calculators/A&P  Initially patient was pretty sleepy but after giving him some time and initial work-up, he is now much more awake and alert and able to tell me that he does not know what happened today or how he got to the hospital.  I think he was probably postictal from a seizure again today.  Depakote level is obviously subtherapeutic given he just restarted it yesterday.  We will give some more Depakote and is not sure what happened to the paper prescription so we will send a prescription to the [facility].  Otherwise, he declines repeat head CT, which had initially been ordered for altered mental status but now he is awake and alert and oriented and seemingly stable for discharge. Final Clinical Impression(s) / ED  Diagnoses Final diagnoses:  None    Rx / DC Orders ED Discharge Orders     None        Pricilla Loveless, MD 05/04/21 1654

## 2021-05-04 NOTE — Discharge Instructions (Addendum)
-   According to [place] law, you can not drive unless you are seizure / syncope free for at least 6 months and under physician's care.  °  °- Please maintain precautions. Do not participate in activities where a loss of awareness could harm you or someone else. No swimming alone, no tub bathing, no hot tubs, no driving, no operating motorized vehicles (cars, ATVs, motocycles, etc), lawnmowers, power tools or firearms. No standing at heights, such as rooftops, ladders or stairs. Avoid hot objects such as stoves, heaters, open fires. Wear a helmet when riding a bicycle, scooter, skateboard, etc. and avoid areas of traffic. Set your water heater to 120 degrees or less.  °

## 2021-05-05 ENCOUNTER — Other Ambulatory Visit: Payer: Self-pay

## 2021-05-06 ENCOUNTER — Other Ambulatory Visit: Payer: Self-pay

## 2021-05-06 ENCOUNTER — Emergency Department (HOSPITAL_COMMUNITY): Payer: Medicare Other

## 2021-05-06 ENCOUNTER — Inpatient Hospital Stay (HOSPITAL_COMMUNITY)
Admission: EM | Admit: 2021-05-06 | Discharge: 2021-05-14 | DRG: 100 | Disposition: A | Discharge status: Home / Self Care | Payer: Medicare Other | Attending: Internal Medicine | Admitting: Internal Medicine

## 2021-05-06 ENCOUNTER — Encounter (HOSPITAL_COMMUNITY): Payer: Self-pay | Admitting: Emergency Medicine

## 2021-05-06 DIAGNOSIS — G9341 Metabolic encephalopathy: Secondary | ICD-10-CM | POA: Diagnosis present

## 2021-05-06 DIAGNOSIS — Z6824 Body mass index (BMI) 24.0-24.9, adult: Secondary | ICD-10-CM

## 2021-05-06 DIAGNOSIS — F419 Anxiety disorder, unspecified: Secondary | ICD-10-CM | POA: Diagnosis present

## 2021-05-06 DIAGNOSIS — S022XXA Fracture of nasal bones, initial encounter for closed fracture: Secondary | ICD-10-CM | POA: Diagnosis present

## 2021-05-06 DIAGNOSIS — I10 Essential (primary) hypertension: Secondary | ICD-10-CM | POA: Diagnosis present

## 2021-05-06 DIAGNOSIS — D649 Anemia, unspecified: Secondary | ICD-10-CM | POA: Diagnosis present

## 2021-05-06 DIAGNOSIS — E8721 Acute metabolic acidosis: Secondary | ICD-10-CM | POA: Diagnosis present

## 2021-05-06 DIAGNOSIS — W19XXXA Unspecified fall, initial encounter: Secondary | ICD-10-CM | POA: Diagnosis present

## 2021-05-06 DIAGNOSIS — Z9114 Patient's other noncompliance with medication regimen: Secondary | ICD-10-CM

## 2021-05-06 DIAGNOSIS — S0083XA Contusion of other part of head, initial encounter: Secondary | ICD-10-CM | POA: Diagnosis present

## 2021-05-06 DIAGNOSIS — E0590 Thyrotoxicosis, unspecified without thyrotoxic crisis or storm: Secondary | ICD-10-CM | POA: Diagnosis present

## 2021-05-06 DIAGNOSIS — E876 Hypokalemia: Secondary | ICD-10-CM | POA: Diagnosis present

## 2021-05-06 DIAGNOSIS — R569 Unspecified convulsions: Secondary | ICD-10-CM

## 2021-05-06 DIAGNOSIS — F209 Schizophrenia, unspecified: Secondary | ICD-10-CM | POA: Diagnosis present

## 2021-05-06 DIAGNOSIS — N179 Acute kidney failure, unspecified: Secondary | ICD-10-CM | POA: Diagnosis present

## 2021-05-06 DIAGNOSIS — F17210 Nicotine dependence, cigarettes, uncomplicated: Secondary | ICD-10-CM | POA: Diagnosis present

## 2021-05-06 DIAGNOSIS — R451 Restlessness and agitation: Secondary | ICD-10-CM | POA: Diagnosis present

## 2021-05-06 DIAGNOSIS — E162 Hypoglycemia, unspecified: Secondary | ICD-10-CM | POA: Diagnosis present

## 2021-05-06 DIAGNOSIS — Z5900 Homelessness unspecified: Secondary | ICD-10-CM

## 2021-05-06 DIAGNOSIS — Z20822 Contact with and (suspected) exposure to covid-19: Secondary | ICD-10-CM | POA: Diagnosis present

## 2021-05-06 DIAGNOSIS — E440 Moderate protein-calorie malnutrition: Secondary | ICD-10-CM | POA: Diagnosis present

## 2021-05-06 DIAGNOSIS — Z8673 Personal history of transient ischemic attack (TIA), and cerebral infarction without residual deficits: Secondary | ICD-10-CM

## 2021-05-06 DIAGNOSIS — G40909 Epilepsy, unspecified, not intractable, without status epilepticus: Principal | ICD-10-CM | POA: Diagnosis present

## 2021-05-06 DIAGNOSIS — Z781 Physical restraint status: Secondary | ICD-10-CM

## 2021-05-06 DIAGNOSIS — R4182 Altered mental status, unspecified: Secondary | ICD-10-CM | POA: Diagnosis not present

## 2021-05-06 HISTORY — DX: Homelessness unspecified: Z59.00

## 2021-05-06 LAB — CBG MONITORING, ED
Glucose-Capillary: 65 mg/dL — ABNORMAL LOW (ref 70–99)
Glucose-Capillary: 75 mg/dL (ref 70–99)
Glucose-Capillary: 77 mg/dL (ref 70–99)
Glucose-Capillary: 82 mg/dL (ref 70–99)
Glucose-Capillary: 83 mg/dL (ref 70–99)
Glucose-Capillary: 85 mg/dL (ref 70–99)
Glucose-Capillary: 93 mg/dL (ref 70–99)
Glucose-Capillary: 94 mg/dL (ref 70–99)
Glucose-Capillary: 95 mg/dL (ref 70–99)

## 2021-05-06 LAB — CBC WITH DIFFERENTIAL/PLATELET
Abs Immature Granulocytes: 0.03 10*3/uL (ref 0.00–0.07)
Basophils Absolute: 0 10*3/uL (ref 0.0–0.1)
Basophils Relative: 0 %
Eosinophils Absolute: 0 10*3/uL (ref 0.0–0.5)
Eosinophils Relative: 0 %
HCT: 40.1 % (ref 39.0–52.0)
Hemoglobin: 13.7 g/dL (ref 13.0–17.0)
Immature Granulocytes: 0 %
Lymphocytes Relative: 10 %
Lymphs Abs: 0.8 10*3/uL (ref 0.7–4.0)
MCH: 29.9 pg (ref 26.0–34.0)
MCHC: 34.2 g/dL (ref 30.0–36.0)
MCV: 87.6 fL (ref 80.0–100.0)
Monocytes Absolute: 0.5 10*3/uL (ref 0.1–1.0)
Monocytes Relative: 6 %
Neutro Abs: 6.8 10*3/uL (ref 1.7–7.7)
Neutrophils Relative %: 84 %
Platelets: 225 10*3/uL (ref 150–400)
RBC: 4.58 MIL/uL (ref 4.22–5.81)
RDW: 13.2 % (ref 11.5–15.5)
WBC: 8.1 10*3/uL (ref 4.0–10.5)
nRBC: 0 % (ref 0.0–0.2)

## 2021-05-06 LAB — RESP PANEL BY RT-PCR (FLU A&B, COVID) ARPGX2
Influenza A by PCR: NEGATIVE
Influenza B by PCR: NEGATIVE
SARS Coronavirus 2 by RT PCR: NEGATIVE

## 2021-05-06 LAB — BASIC METABOLIC PANEL
Anion gap: 19 — ABNORMAL HIGH (ref 5–15)
BUN: 23 mg/dL — ABNORMAL HIGH (ref 6–20)
CO2: 15 mmol/L — ABNORMAL LOW (ref 22–32)
Calcium: 9 mg/dL (ref 8.9–10.3)
Chloride: 106 mmol/L (ref 98–111)
Creatinine, Ser: 1.06 mg/dL (ref 0.61–1.24)
GFR, Estimated: >60 mL/min (ref >60)
Glucose, Bld: 73 mg/dL (ref 70–99)
Potassium: 3.8 mmol/L (ref 3.5–5.1)
Sodium: 140 mmol/L (ref 135–145)

## 2021-05-06 MED ORDER — LABETALOL HCL 5 MG/ML IV SOLN
20.0000 mg | Freq: Once | INTRAVENOUS | Status: AC
  Administered 2021-05-06: 20 mg via INTRAVENOUS
  Filled 2021-05-06: qty 4

## 2021-05-06 MED ORDER — DIVALPROEX SODIUM 500 MG PO DR TAB
500.0000 mg | DELAYED_RELEASE_TABLET | Freq: Once | ORAL | Status: AC
  Administered 2021-05-06: 500 mg via ORAL
  Filled 2021-05-06: qty 1

## 2021-05-06 MED ORDER — CHLORHEXIDINE GLUCONATE 0.12% ORAL RINSE (MEDLINE KIT)
15.0000 mL | Freq: Two times a day (BID) | OROMUCOSAL | Status: DC
  Administered 2021-05-06 – 2021-05-14 (×15): 15 mL via OROMUCOSAL

## 2021-05-06 MED ORDER — SODIUM CHLORIDE 0.9 % IV SOLN
75.0000 mL/h | INTRAVENOUS | Status: DC
  Administered 2021-05-06 – 2021-05-11 (×6): 75 mL/h via INTRAVENOUS

## 2021-05-06 MED ORDER — ONDANSETRON HCL 4 MG/2ML IJ SOLN: 4.0000 mg | Freq: Four times a day (QID) | INTRAMUSCULAR | Status: DC | PRN

## 2021-05-06 MED ORDER — ONDANSETRON HCL 4 MG PO TABS: 4.0000 mg | ORAL_TABLET | Freq: Four times a day (QID) | ORAL | Status: DC | PRN

## 2021-05-06 MED ORDER — ACETAMINOPHEN 650 MG RE SUPP: 650.0000 mg | RECTAL | Status: DC | PRN

## 2021-05-06 MED ORDER — ORAL CARE MOUTH RINSE
15.0000 mL | OROMUCOSAL | Status: DC
  Administered 2021-05-06 – 2021-05-12 (×30): 15 mL via OROMUCOSAL

## 2021-05-06 MED ORDER — AMLODIPINE BESYLATE 5 MG PO TABS
5.0000 mg | ORAL_TABLET | Freq: Once | ORAL | Status: AC
  Administered 2021-05-06: 5 mg via ORAL
  Filled 2021-05-06: qty 1

## 2021-05-06 MED ORDER — GLUCOSE 40 % PO GEL
1.0000 | ORAL | Status: DC | PRN
  Administered 2021-05-06: 31 g via ORAL
  Filled 2021-05-06: qty 1.21
  Filled 2021-05-06: qty 1

## 2021-05-06 MED ORDER — SODIUM CHLORIDE 0.9 % IV BOLUS
1000.0000 mL | Freq: Once | INTRAVENOUS | Status: AC
  Administered 2021-05-06: 1000 mL via INTRAVENOUS

## 2021-05-06 MED ORDER — LABETALOL HCL 5 MG/ML IV SOLN
10.0000 mg | INTRAVENOUS | Status: DC | PRN
Start: 2021-05-06 — End: 2021-05-14
  Administered 2021-05-09 – 2021-05-10 (×2): 20 mg via INTRAVENOUS
  Filled 2021-05-06 (×3): qty 4

## 2021-05-06 MED ORDER — DIVALPROEX SODIUM 250 MG PO DR TAB
500.0000 mg | DELAYED_RELEASE_TABLET | Freq: Two times a day (BID) | ORAL | Status: DC
  Administered 2021-05-06 – 2021-05-14 (×16): 500 mg via ORAL
  Filled 2021-05-06 (×4): qty 2
  Filled 2021-05-06: qty 1
  Filled 2021-05-06 (×12): qty 2

## 2021-05-06 MED ORDER — ENOXAPARIN SODIUM 40 MG/0.4ML IJ SOSY
40.0000 mg | PREFILLED_SYRINGE | INTRAMUSCULAR | Status: DC
  Administered 2021-05-06 – 2021-05-13 (×8): 40 mg via SUBCUTANEOUS
  Filled 2021-05-06 (×8): qty 0.4

## 2021-05-06 MED ORDER — ACETAMINOPHEN 325 MG PO TABS
650.0000 mg | ORAL_TABLET | ORAL | Status: DC | PRN
  Administered 2021-05-10 – 2021-05-12 (×4): 650 mg via ORAL
  Filled 2021-05-06 (×4): qty 2

## 2021-05-06 NOTE — H&P (Addendum)
History and Physical    Arthur [name] WER:[id number] DOB: [birth year] DOA: 05/06/2021  PCP: Pcp, No  Patient coming from: Homeless  I have personally briefly reviewed patient's old medical records in [facility]  Chief Complaint: Seizures  HPI: Arthur [name] is a 50 y.o. male with medical history significant of stroke, possibly seizures.  Pt homeless.  Found down by EMS laying in parking lot in rain.  BGL 59.  Denies EtOH or substance abuse other than smoking.  Patient states he had a stroke in the past.  He said he has had unresponsive episodes but never been diagnosed with seizures.  3 ED visits in 3 days with seizures during last 2.  Tried to start him on depakote but never picked this med up.  Having apparent seizures at times in ED.   ED Course: MRI brain neg for acute findings.  Dr. Lynelle Doctor reports he spoke with Dr. Amada Jupiter who wanted pt admitted to [facility] for EEG.  Pt started on Depakote.   Review of Systems: As per HPI, otherwise all review of systems negative.  Past Medical History:  Diagnosis Date   Homeless    Stroke [facility])     History reviewed. No pertinent surgical history.   reports that he has been smoking cigarettes. He does not have any smokeless tobacco history on file. He reports that he does not currently use alcohol. He reports that he does not currently use drugs.  No Known Allergies  Family History  Problem Relation Age of Onset   Seizures Neg Hx      Prior to Admission medications   Medication Sig Start Date End Date Taking? Authorizing Provider  divalproex (DEPAKOTE) 500 MG DR tablet Take 1 tablet (500 mg total) by mouth 2 (two) times daily. Patient not taking: Reported on 05/06/2021 05/04/21   Pricilla Loveless, MD    Physical Exam: Vitals:   05/06/21 1615 05/06/21 1800 05/06/21 1801 05/06/21 1915  BP: (!) 157/113  (!) 183/102 (!) 190/117  Pulse: 75  81 86  Resp: 17  20 17   Temp:  (!) 97 F (36.1 C) (!) 97 F (36.1 C)    TempSrc:   Oral   SpO2: 99%  98% 100%    Constitutional: NAD, calm, comfortable Eyes: Abrasion left orbit ENMT: Mucous membranes are moist. Posterior pharynx clear of any exudate or lesions.Normal dentition.  Neck: normal, supple, no masses, no thyromegaly Respiratory: clear to auscultation bilaterally, no wheezing, no crackles. Normal respiratory effort. No accessory muscle use.  Cardiovascular: Regular rate and rhythm, no murmurs / rubs / gallops. No extremity edema. 2+ pedal pulses. No carotid bruits.  Abdomen: no tenderness, no masses palpated. No hepatosplenomegaly. Bowel sounds positive.  Musculoskeletal: no clubbing / cyanosis. No joint deformity upper and lower extremities. Good ROM, no contractures. Normal muscle tone.  Skin: Abrasions to back of hand Neurologic: CN 2-12 grossly intact. Sensation intact, DTR normal. Strength 5/5 in all 4.  Psychiatric: Delayed affect, somewhat slow to arouse.   Labs on Admission: I have personally reviewed following labs and imaging studies  CBC: Recent Labs  Lab 05/03/21 0922 05/04/21 1448 05/06/21 1100  WBC 11.7* 8.1 8.1  NEUTROABS 9.8* 5.2 6.8  HGB 15.0 13.6 13.7  HCT 43.5 39.2 40.1  MCV 86.3 85.8 87.6  PLT 240 212 225   Basic Metabolic Panel: Recent Labs  Lab 05/03/21 0922 05/04/21 1448 05/06/21 1100  NA 138 136 140  K 3.6 3.5 3.8  CL 104 105 106  CO2  17* 18* 15*  GLUCOSE 65* 83 73  BUN 24* 25* 23*  CREATININE 1.31* 1.25* 1.06  CALCIUM 9.4 9.2 9.0   GFR: Estimated Creatinine Clearance: 103.5 mL/min (by C-G formula based on SCr of 1.06 mg/dL). Liver Function Tests: Recent Labs  Lab 05/03/21 0922 05/04/21 1448  AST 33 34  ALT 24 24  ALKPHOS 87 72  BILITOT 1.8* 1.6*  PROT 7.8 6.8  ALBUMIN 4.4 4.0   No results for input(s): LIPASE, AMYLASE in the last 168 hours. No results for input(s): AMMONIA in the last 168 hours. Coagulation Profile: No results for input(s): INR, PROTIME in the last 168 hours. Cardiac  Enzymes: No results for input(s): CKTOTAL, CKMB, CKMBINDEX, TROPONINI in the last 168 hours. BNP (last 3 results) No results for input(s): PROBNP in the last 8760 hours. HbA1C: No results for input(s): HGBA1C in the last 72 hours. CBG: Recent Labs  Lab 05/06/21 1316 05/06/21 1454 05/06/21 1519 05/06/21 1808 05/06/21 1939  GLUCAP 93 65* 94 82 77   Lipid Profile: No results for input(s): CHOL, HDL, LDLCALC, TRIG, CHOLHDL, LDLDIRECT in the last 72 hours. Thyroid Function Tests: No results for input(s): TSH, T4TOTAL, FREET4, T3FREE, THYROIDAB in the last 72 hours. Anemia Panel: No results for input(s): VITAMINB12, FOLATE, FERRITIN, TIBC, IRON, RETICCTPCT in the last 72 hours. Urine analysis:    Component Value Date/Time   COLORURINE AMBER (A) 05/04/2021 1448   APPEARANCEUR CLEAR 05/04/2021 1448   LABSPEC >=1.030 05/04/2021 1448   PHURINE 6.0 05/04/2021 1448   GLUCOSEU NEGATIVE 05/04/2021 1448   HGBUR TRACE (A) 05/04/2021 1448   BILIRUBINUR MODERATE (A) 05/04/2021 1448   KETONESUR >=80 (A) 05/04/2021 1448   PROTEINUR TRACE (A) 05/04/2021 1448   NITRITE NEGATIVE 05/04/2021 1448   LEUKOCYTESUR NEGATIVE 05/04/2021 1448    Radiological Exams on Admission: DG Orbits  Result Date: 05/06/2021 CLINICAL DATA:  Altered mental status.  Clearing for MRI EXAM: ORBITS - COMPLETE 4+ VIEW COMPARISON:  None. FINDINGS: There is no evidence of metallic foreign body within the orbits. No significant bone abnormality identified. IMPRESSION: No evidence of metallic foreign body within the orbits. Electronically Signed   By: Marlan Palau M.D.   On: 05/06/2021 16:06   DG Chest 1 View  Result Date: 05/06/2021 CLINICAL DATA:  Altered mental status. EXAM: CHEST  1 VIEW COMPARISON:  None. FINDINGS: The heart size and mediastinal contours are within normal limits. Both lungs are clear. The visualized skeletal structures are unremarkable. IMPRESSION: No active disease. Electronically Signed   By: Marlan Palau M.D.   On: 05/06/2021 16:04   CT Head Wo Contrast  Result Date: 05/06/2021 CLINICAL DATA:  Mental status changes EXAM: CT HEAD WITHOUT CONTRAST TECHNIQUE: Contiguous axial images were obtained from the base of the skull through the vertex without intravenous contrast. COMPARISON:  05/03/2021 FINDINGS: Brain: No evidence of acute infarction, hemorrhage, hydrocephalus, extra-axial collection or mass lesion/mass effect. Periventricular white matter low attenuation as can be seen with microvascular disease which is greater than expected for the patient's age. Bilateral basal ganglia lacunar infarcts. Small lacunar infarct in the left thalamus. Vascular: No hyperdense vessel or unexpected calcification. Skull: No osseous abnormality. Sinuses/Orbits: Visualized paranasal sinuses are clear. Visualized mastoid sinuses are clear. Visualized orbits demonstrate no focal abnormality. Other: None IMPRESSION: 1. No acute intracranial pathology. 2. Periventricular white matter low attenuation as can be seen with microvascular disease which is greater than expected for the patient's age. Bilateral basal ganglia lacunar infarcts. Small lacunar infarct in  the left thalamus. Electronically Signed   By: Elige Ko M.D.   On: 05/06/2021 14:03   MR BRAIN WO CONTRAST  Result Date: 05/06/2021 CLINICAL DATA:  Altered mental status, confusion EXAM: MRI HEAD WITHOUT CONTRAST TECHNIQUE: Multiplanar, multiecho pulse sequences of the brain and surrounding structures were obtained without intravenous contrast. COMPARISON:  No prior MRI, correlation is made with CT head 05/06/2021 FINDINGS: Evaluation is somewhat limited by motion artifact. Brain: No restricted diffusion to suggest acute or subacute infarction. No acute hemorrhage, mass, mass effect, or midline shift. Multiple lacunar infarcts in the bilateral thalami, basal ganglia, and corona radiata. Confluent T2 hyperintense signal in the periventricular white matter and pons,  likely the sequela of severe chronic small vessel ischemic disease. Punctate foci of hemosiderin deposition left frontal lobe, right occipital lobe, pons, right thalamus, and left basal ganglia, likely sequela of prior hypertensive microhemorrhages. Vascular: Normal flow voids. Skull and upper cervical spine: Normal marrow signal. Sinuses/Orbits: Negative. Other: None. IMPRESSION: Evaluation is somewhat limited by motion artifact. Within this limitation, no acute intracranial process. Electronically Signed   By: Wiliam Ke M.D.   On: 05/06/2021 18:07   DG Abd 2 Views  Result Date: 05/06/2021 CLINICAL DATA:  Altered Mental status EXAM: ABDOMEN - 2 VIEW COMPARISON:  None. FINDINGS: The bowel gas pattern is normal. There is no evidence of free air. No radio-opaque calculi or other significant radiographic abnormality is seen. IMPRESSION: Negative. Electronically Signed   By: Marlan Palau M.D.   On: 05/06/2021 16:09    EKG: Independently reviewed.  Assessment/Plan Principal Problem:   Seizures (HCC) Active Problems:   HTN (hypertension)    Seizures - MRI neg for acute findings, does show multiple chronic infarcts Admitting to [facility] at neuro request for EEG Started on Depakote HTN - Labetalol PRN  DVT prophylaxis: Lovenox Code Status: Full Family Communication: No family in room Disposition Plan: discharge after seizure work up / cleared by neurology Consults called: Neurology called by EDP Admission status: Place in 79     Dyllan Hughett M. DO [facility]  How to contact the [facility] Attending or Consulting provider 7A - 7P or covering provider during after hours 7P -7A, for this patient?  Check the care team in [facility] and look for a) attending/consulting [facility] provider listed and b) the [facility] team listed Log into [email or web address]  Amion Physician Scheduling and messaging for groups and whole hospitals  On call and physician scheduling software for group practices, residents,  hospitalists and other medical providers for call, clinic, rotation and shift schedules. OnCall [employer] is a hospital-wide system for scheduling doctors and paging doctors on call. EasyPlot is for scientific plotting and data analysis.  [email or web address]  and use [place]'s universal password to access. If you do not have the password, please contact the hospital operator.  Locate the [facility] provider you are looking for under [facility] and page to a number that you can be directly reached. If you still have difficulty reaching the provider, please page the [facility] (Director on Call) for the Hospitalists listed on amion for assistance.  05/06/2021, 7:59 PM

## 2021-05-06 NOTE — ED Notes (Signed)
Patient given hot packs.

## 2021-05-06 NOTE — ED Provider Notes (Signed)
Patient initially seen by Dr. Charm Barges.  Please see his note.  MRI was still pending at the time of shift change.  No acute process noted on MRI.  Suspect patient has had recurrent seizures with his decreased bicarb and elevated anion gap.  Patient's mental status has been improved.  He is now awake and talking.  Considering the several days of the last few days of his altered mental status we will admit him to the hospital for further evaluation including EEG.  Patient will need to be admitted to [facility].  Patient noted to be hypertensive.  We will start him on antihypertensive agents.  I reviewed his vital signs from the previous few days and he has been persistently hypertensive.  We will continue to monitor closely.  We will add on UDS   Linwood Dibbles, MD 05/06/21 [id number]

## 2021-05-06 NOTE — ED Provider Notes (Signed)
[place] DEPT Provider Note   CSN: [id number] Arrival date & time: 05/06/21  1018     History No chief complaint on file.   Arthur [name] is a 50 y.o. male.  Patient has a history of stroke and possibly seizure.  He was found by EMS lying down in a parking lot in the cold rain.  He had no other complaints other than being cold and shivering.  Blood sugar was 59 and given oral glucose.  Patient denies any trauma although does have signs of abrasion to his left orbit.  Admits to tobacco denies any alcohol or drugs.  He is not on any medications for seizure.  Per prior notes he was prescribed Depakote but he did not fill this. Homeless.   The history is provided by the patient and the EMS personnel.  Hypoglycemia Initial blood sugar:  59 Severity:  Mild Onset quality:  Unable to specify Progression:  Unable to specify Chronicity:  New Diabetic status:  Non-diabetic Context: decreased oral intake and diet changes   Relieved by:  Oral glucose Ineffective treatments:  None tried Associated symptoms: seizures (??)   Associated symptoms: no altered mental status, no decreased responsiveness, no shortness of breath, no speech difficulty and no vomiting   Hypertension Pertinent negatives include no chest pain, no abdominal pain and no shortness of breath.      Past Medical History:  Diagnosis Date   Stroke [facility])     There are no problems to display for this patient.   History reviewed. No pertinent surgical history.     History reviewed. No pertinent family history.     Home Medications Prior to Admission medications   Medication Sig Start Date End Date Taking? Authorizing Provider  divalproex (DEPAKOTE) 500 MG DR tablet Take 1 tablet (500 mg total) by mouth 2 (two) times daily. 05/04/21   Sherwood Gambler, MD    Allergies    Patient has no known allergies.  Review of Systems   Review of Systems  Constitutional:  Positive for chills.  Negative for decreased responsiveness and fever.  HENT:  Negative for sore throat.   Eyes:  Negative for visual disturbance.  Respiratory:  Negative for shortness of breath.   Cardiovascular:  Negative for chest pain.  Gastrointestinal:  Negative for abdominal pain and vomiting.  Genitourinary:  Negative for dysuria.  Musculoskeletal:  Negative for neck pain.  Skin:  Negative for rash.  Neurological:  Positive for seizures (??). Negative for speech difficulty.   Physical Exam Updated Vital Signs BP (!) 174/117 (BP Location: Left Arm)   Pulse 87   Temp 97.6 F (36.4 C) (Oral)   Resp 20   SpO2 99%   Physical Exam Vitals and nursing note reviewed.  Constitutional:      General: He is not in acute distress.    Appearance: Normal appearance. He is well-developed.  HENT:     Head: Normocephalic.     Comments: Abrasion left orbit Eyes:     Conjunctiva/sclera: Conjunctivae normal.  Cardiovascular:     Rate and Rhythm: Normal rate and regular rhythm.     Heart sounds: No murmur heard. Pulmonary:     Effort: Pulmonary effort is normal. No respiratory distress.     Breath sounds: Normal breath sounds.  Abdominal:     Palpations: Abdomen is soft.     Tenderness: There is no abdominal tenderness. There is no guarding or rebound.  Musculoskeletal:  General: No swelling or deformity. Normal range of motion.     Cervical back: Neck supple.  Skin:    Capillary Refill: Capillary refill takes less than 2 seconds.     Comments: Patient's skin is cold.  He is shivering.  Neurological:     General: No focal deficit present.     Mental Status: He is alert and oriented to person, place, and time.     Sensory: No sensory deficit.     Motor: No weakness.  Psychiatric:        Mood and Affect: Mood normal.    ED Results / Procedures / Treatments   Labs (all labs ordered are listed, but only abnormal results are displayed) Labs Reviewed  BASIC METABOLIC PANEL - Abnormal; Notable  for the following components:      Result Value   CO2 15 (*)    BUN 23 (*)    Anion gap 19 (*)    All other components within normal limits  CBG MONITORING, ED - Abnormal; Notable for the following components:   Glucose-Capillary 65 (*)    All other components within normal limits  RESP PANEL BY RT-PCR (FLU A&B, COVID) ARPGX2  CBC WITH DIFFERENTIAL/PLATELET  CBG MONITORING, ED  CBG MONITORING, ED  CBG MONITORING, ED  CBG MONITORING, ED  CBG MONITORING, ED    EKG EKG Interpretation  Date/Time:  Friday May 06 2021 11:40:40 EST Ventricular Rate:  84 PR Interval:    QRS Duration: 113 QT Interval:  426 QTC Calculation: 504 R Axis:   60 Text Interpretation: Sinus rhythm with artifact Probable left ventricular hypertrophy Prolonged QT interval Artifact in lead(s) I II III aVR aVL aVF V1 V2 Poor data quality Confirmed by Meridee ScoreButler, Amauria Younts [phone]) on 05/06/2021 11:46:30 AM  Radiology DG Orbits  Result Date: 05/06/2021 CLINICAL DATA:  Altered mental status.  Clearing for MRI EXAM: ORBITS - COMPLETE 4+ VIEW COMPARISON:  None. FINDINGS: There is no evidence of metallic foreign body within the orbits. No significant bone abnormality identified. IMPRESSION: No evidence of metallic foreign body within the orbits. Electronically Signed   By: Marlan Palauharles  Clark M.D.   On: 05/06/2021 16:06   DG Chest 1 View  Result Date: 05/06/2021 CLINICAL DATA:  Altered mental status. EXAM: CHEST  1 VIEW COMPARISON:  None. FINDINGS: The heart size and mediastinal contours are within normal limits. Both lungs are clear. The visualized skeletal structures are unremarkable. IMPRESSION: No active disease. Electronically Signed   By: Marlan Palauharles  Clark M.D.   On: 05/06/2021 16:04   CT Head Wo Contrast  Result Date: 05/06/2021 CLINICAL DATA:  Mental status changes EXAM: CT HEAD WITHOUT CONTRAST TECHNIQUE: Contiguous axial images were obtained from the base of the skull through the vertex without intravenous contrast.  COMPARISON:  05/03/2021 FINDINGS: Brain: No evidence of acute infarction, hemorrhage, hydrocephalus, extra-axial collection or mass lesion/mass effect. Periventricular white matter low attenuation as can be seen with microvascular disease which is greater than expected for the patient's age. Bilateral basal ganglia lacunar infarcts. Small lacunar infarct in the left thalamus. Vascular: No hyperdense vessel or unexpected calcification. Skull: No osseous abnormality. Sinuses/Orbits: Visualized paranasal sinuses are clear. Visualized mastoid sinuses are clear. Visualized orbits demonstrate no focal abnormality. Other: None IMPRESSION: 1. No acute intracranial pathology. 2. Periventricular white matter low attenuation as can be seen with microvascular disease which is greater than expected for the patient's age. Bilateral basal ganglia lacunar infarcts. Small lacunar infarct in the left thalamus. Electronically Signed  By: Elige Ko M.D.   On: 05/06/2021 14:03   MR BRAIN WO CONTRAST  Result Date: 05/06/2021 CLINICAL DATA:  Altered mental status, confusion EXAM: MRI HEAD WITHOUT CONTRAST TECHNIQUE: Multiplanar, multiecho pulse sequences of the brain and surrounding structures were obtained without intravenous contrast. COMPARISON:  No prior MRI, correlation is made with CT head 05/06/2021 FINDINGS: Evaluation is somewhat limited by motion artifact. Brain: No restricted diffusion to suggest acute or subacute infarction. No acute hemorrhage, mass, mass effect, or midline shift. Multiple lacunar infarcts in the bilateral thalami, basal ganglia, and corona radiata. Confluent T2 hyperintense signal in the periventricular white matter and pons, likely the sequela of severe chronic small vessel ischemic disease. Punctate foci of hemosiderin deposition left frontal lobe, right occipital lobe, pons, right thalamus, and left basal ganglia, likely sequela of prior hypertensive microhemorrhages. Vascular: Normal flow voids.  Skull and upper cervical spine: Normal marrow signal. Sinuses/Orbits: Negative. Other: None. IMPRESSION: Evaluation is somewhat limited by motion artifact. Within this limitation, no acute intracranial process. Electronically Signed   By: Wiliam Ke M.D.   On: 05/06/2021 18:07   DG Abd 2 Views  Result Date: 05/06/2021 CLINICAL DATA:  Altered Mental status EXAM: ABDOMEN - 2 VIEW COMPARISON:  None. FINDINGS: The bowel gas pattern is normal. There is no evidence of free air. No radio-opaque calculi or other significant radiographic abnormality is seen. IMPRESSION: Negative. Electronically Signed   By: Marlan Palau M.D.   On: 05/06/2021 16:09    Procedures Procedures   Medications Ordered in ED Medications  dextrose (GLUTOSE) 40 % oral gel 31 g (31 g Oral Given 05/06/21 1457)  sodium chloride 0.9 % bolus 1,000 mL (0 mLs Intravenous Stopped 05/06/21 1500)  divalproex (DEPAKOTE) DR tablet 500 mg (500 mg Oral Given 05/06/21 1325)  amLODipine (NORVASC) tablet 5 mg (5 mg Oral Given 05/06/21 1843)    ED Course  I have reviewed the triage vital signs and the nursing notes.  Pertinent labs & imaging results that were available during my care of the patient were reviewed by me and considered in my medical decision making (see chart for details).  Clinical Course as of 05/06/21 1909  Caleen Essex May 06, 2021  1035 Reviewed labs from last 2 visits.  His alcohol and tox screens have been negative.  Do not feel these need to be repeated.  He did have some ketones in his last urinalysis.  Had a low blood sugar during his initial presentation.  At discharge the attempted to get a patient a follow-up visit in [facility] seen and sent prescription there for Depakote. [MB]  1104 Patient states he had a stroke in the past.  He said he has had unresponsive episodes but never been diagnosed with seizures. [MB]  1229 Discussed with neurology Dr. Derry Lory.  He said that he did not need any further imaging or  EEG as he has not failed treatment yet.  He just needs to take his medicine. [MB]  1407 Patient now is more altered and less responsive.  Repeat CT does not show any acute findings.  Reviewed with neurology and they are recommending an MRI. [MB]  1501 Informed by radiology that the patient needs x-rays of orbits chest abdomen and pelvis to make sure he does not have any foreign body that would preclude doing an MRI. [MB]  1505 Reassessed patient.  He is arousable to voice.  He knows he is in the hospital.  He denies any medical devices  in him.  He understands he is can get an MRI.  He said he has not had much sleep recently and he is very tired. [MB]  [id number] Patient's x-ray is did not show any acute metallic foreign body.  We will let MRI know.  [MB]    Clinical Course User Index [MB] Hayden Rasmussen, MD   MDM Rules/Calculators/A&P                          This patient complains of altered mental status, unresponsive spell, possible seizure; this involves an extensive number of treatment Options and is a complaint that carries with it a high risk of complications and Morbidity. The differential includes stroke, seizure, hypoglycemia, metabolic derangement, intoxication  I ordered, reviewed and interpreted labs, which included CBC with normal white count normal hemoglobin, chemistries with low bicarb possibly reflecting some acidosis/seizure, COVID and flu negative, CBG mildly low patient given oral glucose with improvement I ordered medication Depakote oral IV fluids I ordered imaging studies which included chest x-ray and x-rays of the orbits and abdomen, CT head, MRI brain and I independently    visualized and interpreted imaging which showed no acute findings, multiple chronic infarcts Additional history obtained from EMS Previous records obtained and reviewed in epic including prior ED visits I consulted neurology Dr. Lorrin Goodell and Dr. Rory Percy and discussed lab and imaging  findings  Critical Interventions: None  After the interventions stated above, I reevaluated the patient and found patient still to be slightly altered although is oriented.  His care is signed out to oncoming provider Dr. Tomi Bamberger to follow-up on results of MRI.  May need further neurologic evaluation.   Final Clinical Impression(s) / ED Diagnoses Final diagnoses:  Altered mental status    Rx / DC Orders ED Discharge Orders     None        Hayden Rasmussen, MD 05/06/21 [phone]

## 2021-05-06 NOTE — Plan of Care (Signed)
Curb sided by Dr. Charm Barges. Patient with a history of strokes presenting with multiple episodes of unresponsiveness and shaking. Has been started on Depakote but is subtherapeutic level. Toxicology negative Has had a fluctuating course in terms of his mentation in the emergency room today. I recommended an MRI of the brain be obtained and then discussed with the on-call neurologist to see if he needs transfer for EEG and further evaluation. Dr. Charm Barges is ordered an MRI and will follow up.  -- Milon Dikes, MD Neurologist [facility] Pager: [phone]

## 2021-05-06 NOTE — Progress Notes (Signed)
TOC CSW spoke with pt at bedside, pt was slow to answer this writer's questions, pt at times just looks at CSW and does not respond. CSW does not know if pt understands what is being asked of him. Pt denies knowing how he came to ED, pt stated he has a family but is unable to provide contact information. Pt was seen in ED earlier this week for the same concerns and does not remember where he was d/c to. Pt is unable to articulate what his needs are. CSW made MD and RN aware.CSW continues to follow   Valentina Shaggy.Dorsey Charette, MSW, LCSWA [facility]  Transitions of Care Clinical Social Worker I Direct Dial: [phone]  Fax: [phone] [email or web address]

## 2021-05-06 NOTE — ED Triage Notes (Signed)
Patient BIBA. Found in a parking lot, exposed and wet. Patient was confused as to how they got there. Patient brought into ER for similar situation on the 7th.  No px. Given 30g oral dextrose   CBG: 60 BP: 188/110 HR: 95 SPO2: 100% on RA

## 2021-05-06 NOTE — ED Notes (Signed)
Patient becoming less responsive. MD notified.

## 2021-05-06 NOTE — ED Notes (Signed)
Patient's daughter's number is not in file and patient can not recall her number.

## 2021-05-07 ENCOUNTER — Observation Stay (HOSPITAL_COMMUNITY): Payer: Medicare Other

## 2021-05-07 ENCOUNTER — Other Ambulatory Visit: Payer: Self-pay

## 2021-05-07 ENCOUNTER — Encounter (HOSPITAL_COMMUNITY): Payer: Self-pay | Admitting: Internal Medicine

## 2021-05-07 DIAGNOSIS — G9341 Metabolic encephalopathy: Secondary | ICD-10-CM | POA: Diagnosis present

## 2021-05-07 DIAGNOSIS — I10 Essential (primary) hypertension: Secondary | ICD-10-CM | POA: Diagnosis present

## 2021-05-07 DIAGNOSIS — W19XXXA Unspecified fall, initial encounter: Secondary | ICD-10-CM | POA: Diagnosis present

## 2021-05-07 DIAGNOSIS — R4182 Altered mental status, unspecified: Secondary | ICD-10-CM | POA: Diagnosis not present

## 2021-05-07 DIAGNOSIS — E0590 Thyrotoxicosis, unspecified without thyrotoxic crisis or storm: Secondary | ICD-10-CM | POA: Diagnosis present

## 2021-05-07 DIAGNOSIS — Z20822 Contact with and (suspected) exposure to covid-19: Secondary | ICD-10-CM | POA: Diagnosis present

## 2021-05-07 DIAGNOSIS — R451 Restlessness and agitation: Secondary | ICD-10-CM | POA: Diagnosis present

## 2021-05-07 DIAGNOSIS — Z8673 Personal history of transient ischemic attack (TIA), and cerebral infarction without residual deficits: Secondary | ICD-10-CM | POA: Diagnosis not present

## 2021-05-07 DIAGNOSIS — E876 Hypokalemia: Secondary | ICD-10-CM | POA: Diagnosis present

## 2021-05-07 DIAGNOSIS — Z9114 Patient's other noncompliance with medication regimen: Secondary | ICD-10-CM | POA: Diagnosis not present

## 2021-05-07 DIAGNOSIS — Z6824 Body mass index (BMI) 24.0-24.9, adult: Secondary | ICD-10-CM | POA: Diagnosis not present

## 2021-05-07 DIAGNOSIS — Z5900 Homelessness unspecified: Secondary | ICD-10-CM | POA: Diagnosis not present

## 2021-05-07 DIAGNOSIS — G40909 Epilepsy, unspecified, not intractable, without status epilepticus: Secondary | ICD-10-CM | POA: Diagnosis present

## 2021-05-07 DIAGNOSIS — F209 Schizophrenia, unspecified: Secondary | ICD-10-CM | POA: Diagnosis present

## 2021-05-07 DIAGNOSIS — R569 Unspecified convulsions: Secondary | ICD-10-CM | POA: Diagnosis present

## 2021-05-07 DIAGNOSIS — S0083XA Contusion of other part of head, initial encounter: Secondary | ICD-10-CM | POA: Diagnosis present

## 2021-05-07 DIAGNOSIS — S022XXA Fracture of nasal bones, initial encounter for closed fracture: Secondary | ICD-10-CM | POA: Diagnosis present

## 2021-05-07 DIAGNOSIS — E440 Moderate protein-calorie malnutrition: Secondary | ICD-10-CM | POA: Diagnosis present

## 2021-05-07 DIAGNOSIS — Z781 Physical restraint status: Secondary | ICD-10-CM | POA: Diagnosis not present

## 2021-05-07 DIAGNOSIS — F419 Anxiety disorder, unspecified: Secondary | ICD-10-CM | POA: Diagnosis present

## 2021-05-07 DIAGNOSIS — F17210 Nicotine dependence, cigarettes, uncomplicated: Secondary | ICD-10-CM | POA: Diagnosis present

## 2021-05-07 DIAGNOSIS — E8721 Acute metabolic acidosis: Secondary | ICD-10-CM | POA: Diagnosis present

## 2021-05-07 DIAGNOSIS — N179 Acute kidney failure, unspecified: Secondary | ICD-10-CM | POA: Diagnosis present

## 2021-05-07 DIAGNOSIS — E162 Hypoglycemia, unspecified: Secondary | ICD-10-CM | POA: Diagnosis present

## 2021-05-07 DIAGNOSIS — D649 Anemia, unspecified: Secondary | ICD-10-CM | POA: Diagnosis present

## 2021-05-07 LAB — GLUCOSE, CAPILLARY
Glucose-Capillary: 93 mg/dL (ref 70–99)
Glucose-Capillary: 73 mg/dL (ref 70–99)

## 2021-05-07 LAB — HIV ANTIBODY (ROUTINE TESTING W REFLEX): HIV Screen 4th Generation wRfx: NONREACTIVE

## 2021-05-07 LAB — RAPID URINE DRUG SCREEN, HOSP PERFORMED
Amphetamines: NOT DETECTED
Barbiturates: NOT DETECTED
Benzodiazepines: NOT DETECTED
Cocaine: NOT DETECTED
Opiates: NOT DETECTED
Tetrahydrocannabinol: NOT DETECTED

## 2021-05-07 LAB — ETHANOL: Alcohol, Ethyl (B): <10 mg/dL (ref <10)

## 2021-05-07 LAB — CBG MONITORING, ED: Glucose-Capillary: 83 mg/dL (ref 70–99)

## 2021-05-07 MED ORDER — ENSURE ENLIVE PO LIQD
237.0000 mL | Freq: Two times a day (BID) | ORAL | Status: DC
  Administered 2021-05-07 – 2021-05-09 (×6): 237 mL via ORAL

## 2021-05-07 MED ORDER — AMLODIPINE BESYLATE 5 MG PO TABS
5.0000 mg | ORAL_TABLET | Freq: Every day | ORAL | Status: DC
  Administered 2021-05-07: 5 mg via ORAL
  Filled 2021-05-07: qty 1

## 2021-05-07 NOTE — Progress Notes (Signed)
Pt admitted for seizures anticipate EEG being done. BP (!) 136/93 (BP Location: Left Arm)   Pulse (!) 51   Temp 98.2 F (36.8 C) (Oral) Comment: refused  Resp 20   SpO2 100%  Pt non compliant with admission, did not answer questions. EMT said pt did not speak with them during transport and has acted "unresponsive" since leaving [facility] hospital. Pt has multiple abrasions on bilateral knuckles, as well as his face. Pt did not take part of admission process. Reva Bores 05/07/21 4:28 AM

## 2021-05-07 NOTE — Plan of Care (Signed)
Problem: Coping: Goal: Ability to identify appropriate support needs will improve Outcome: Progressing     

## 2021-05-07 NOTE — Plan of Care (Signed)
Pt admitted for seizures, pt non compliant with admission and would not partrticpate. Admission done with previous coloumns information. BP (!) 136/93 (BP Location: Left Arm)   Pulse (!) 51   Temp 98.2 F (36.8 C) (Oral) Comment: refused  Resp 20   SpO2 100%  blood sugar wnl No s/s of pain.

## 2021-05-07 NOTE — ED Notes (Signed)
[facility] called at [phone]

## 2021-05-07 NOTE — Procedures (Signed)
Routine EEG Report  Arthur [name] is a 50 y.o. male with a history of strokes and seizures who is undergoing an EEG to evaluate for seizures.  Report: This EEG was acquired with electrodes placed according to the International 10-20 electrode system (including Fp1, Fp2, F3, F4, C3, C4, P3, P4, O1, O2, T3, T4, T5, T6, A1, A2, Fz, Cz, Pz). The following electrodes were missing or displaced: none.  The occipital dominant rhythm was 7-8 Hz. This activity is reactive to stimulation. Drowsiness was manifested by background fragmentation; deeper stages of sleep were identified by K complexes and sleep spindles. There was superimposed bifrontal focal slowing. There were no interictal epileptiform discharges. There were no electrographic seizures identified. Photic stimulation and hyperventilation were not performed.   Impression and clinical correlation: This EEG was obtained while awake and asleep and is abnormal due to mild diffuse slowing indicative of global cerebral dysfunction as well as focal bifrontal slowing indicating superimposed focal cerebral dysfunction in these areas. Epileptiform abnormalities were not seen during this recording. Patient will be monitored on cEEG overnight.  Bing Neighbors, MD [facility] [phone]  If 7pm- 7am, please page neurology on call as listed in AMION.

## 2021-05-07 NOTE — Progress Notes (Signed)
[facility] PROGRESS NOTE   Arthur [name] XTG:[id number] DOB: [birth year] DOA: 05/06/2021  PCP: Pcp, No  Brief History/Interval Summary: 50 year old Caucasian male with a past medical history of stroke and possibly seizure disorder who has had 3 visits to the emergency department in 3 days with seizure activity.  Subsequently hospitalized for further management.  Reason for Visit: Seizures  Consultants: Neurology  Procedures: EEG    Subjective/Interval History: Patient somewhat slow to respond.  Does not answer questions appropriately.  History is limited.     Assessment/Plan:  Seizure disorder MRI brain was negative for acute findings.  Chronic infarcts were noted.  Patient was started on Depakote.  Neurology has been consulted.  Plan is for EEG.  Discussed with neurology today. Metabolic acidosis likely from seizure activity.  Recheck labs tomorrow.  Check magnesium.  History of stroke Chronic infarcts noted on MRI brain.  Patient not on any antiplatelet agents.  Lipid panel as well.  Initiate aspirin.  Elevated blood pressure, likely essential hypertension No known history of hypertension.  Not noted to be on any antihypertensives prior to admission.  Blood pressure readings were high yesterday.  Continue to monitor trend.  Continue as needed agents.  May need scheduled medications.  Homelessness Patient is homeless.  Will involve TOC.  Urine drug screen unremarkable.  Alcohol level less than 10.  Minimally displaced fracture of the left nasal bone This is secondary to fall he sustained after seizure several days ago.  He also has soft tissue contusion of the left cheek and chin.  Outpatient follow-up with ENT.   DVT Prophylaxis: Lovenox Code Status: Full code Family Communication: No family at bedside Disposition Plan: Patient is homeless  Status is: Observation  The patient will require care spanning > 2 midnights and should be moved to inpatient  because: Concern for seizure activity     Medications: Scheduled:  chlorhexidine gluconate (MEDLINE KIT)  15 mL Mouth Rinse BID   divalproex  500 mg Oral BID   enoxaparin (LOVENOX) injection  40 mg Subcutaneous Q24H   feeding supplement  237 mL Oral BID BM   mouth rinse  15 mL Mouth Rinse 10 times per day   Continuous:  sodium chloride 75 mL/hr (05/06/21 2229)   EVO:[id number] **OR** acetaminophen, dextrose, labetalol, ondansetron **OR** ondansetron (ZOFRAN) IV  Antibiotics: Anti-infectives (From admission, onward)    None       Objective:  Vital Signs  Vitals:   05/06/21 2307 05/07/21 0315 05/07/21 0354 05/07/21 0747  BP: 136/87 (!) 183/114 (!) 136/93 (!) 164/91  Pulse: 71 75 (!) 51 70  Resp: (!) 21 (!) _0 Temp:   98.2 F (36.8 C) 98.7 F (37.1 C)  TempSrc:   Oral Oral  SpO2: 100% 99% 100% 99%    Intake/Output Summary (Last 24 hours) at 05/07/2021 1010 Last data filed at 05/07/2021 0400 Gross per 24 hour  Intake 1000 ml  Output --  Net 1000 ml   There were no vitals filed for this visit.  General appearance: Awake alert.  In no distress.  Somewhat distracted Bruises noted over his face. Resp: Clear to auscultation bilaterally.  Normal effort Cardio: S1-S2 is normal regular.  No S3-S4.  No rubs murmurs or bruit GI: Abdomen is soft.  Nontender nondistended.  Bowel sounds are present normal.  No masses organomegaly Extremities: Bruising noted over both of his lower extremity although he does movement of all of his extremities without much difficulty. Neurologic: No obvious focal  neurological deficits noted.   Lab Results:  Data Reviewed: I have personally reviewed following labs and imaging studies  CBC: Recent Labs  Lab 05/03/21 0922 05/04/21 1448 05/06/21 1100  WBC 11.7* 8.1 8.1  NEUTROABS 9.8* 5.2 6.8  HGB 15.0 13.6 13.7  HCT 43.5 39.2 40.1  MCV 86.3 85.8 87.6  PLT 240 212 950    Basic Metabolic Panel: Recent Labs  Lab  05/03/21 0922 05/04/21 1448 05/06/21 1100  NA 138 136 140  K 3.6 3.5 3.8  CL 104 105 106  CO2 17* 18* 15*  GLUCOSE 65* 83 73  BUN 24* 25* 23*  CREATININE 1.31* 1.25* 1.06  CALCIUM 9.4 9.2 9.0    GFR: Estimated Creatinine Clearance: 103.5 mL/min (by C-G formula based on SCr of 1.06 mg/dL).  Liver Function Tests: Recent Labs  Lab 05/03/21 0922 05/04/21 1448  AST 33 34  ALT 24 24  ALKPHOS 87 72  BILITOT 1.8* 1.6*  PROT 7.8 6.8  ALBUMIN 4.4 4.0      CBG: Recent Labs  Lab 05/06/21 2157 05/06/21 2350 05/07/21 0310 05/07/21 0405 05/07/21 0734  GLUCAP 85 95 83 93 73      Recent Results (from the past 240 hour(s))  Resp Panel by RT-PCR (Flu A&B, Covid) Nasopharyngeal Swab     Status: None   Collection Time: 05/06/21  5:06 PM   Specimen: Nasopharyngeal Swab; Nasopharyngeal(NP) swabs in vial transport medium  Result Value Ref Range Status   SARS Coronavirus 2 by RT PCR NEGATIVE NEGATIVE Final    Comment: (NOTE) SARS-CoV-2 target nucleic acids are NOT DETECTED.  The SARS-CoV-2 RNA is generally detectable in upper respiratory specimens during the acute phase of infection. The lowest concentration of SARS-CoV-2 viral copies this assay can detect is 138 copies/mL. A negative result does not preclude SARS-Cov-2 infection and should not be used as the sole basis for treatment or other patient management decisions. A negative result may occur with  improper specimen collection/handling, submission of specimen other than nasopharyngeal swab, presence of viral mutation(s) within the areas targeted by this assay, and inadequate number of viral copies(<138 copies/mL). A negative result must be combined with clinical observations, patient history, and epidemiological information. The expected result is Negative.  Fact Sheet for Patients:  EntrepreneurPulse.com.au  Fact Sheet for Healthcare Providers:  IncredibleEmployment.be  This  test is no t yet approved or cleared by the [place] FDA and  has been authorized for detection and/or diagnosis of SARS-CoV-2 by FDA under an Emergency Use Authorization (EUA). This EUA will remain  in effect (meaning this test can be used) for the duration of the COVID-19 declaration under Section 564(b)(1) of the Act, 21 U.S.C.section 360bbb-3(b)(1), unless the authorization is terminated  or revoked sooner.       Influenza A by PCR NEGATIVE NEGATIVE Final   Influenza B by PCR NEGATIVE NEGATIVE Final    Comment: (NOTE) The Xpert Xpress SARS-CoV-2/FLU/RSV plus assay is intended as an aid in the diagnosis of influenza from Nasopharyngeal swab specimens and should not be used as a sole basis for treatment. Nasal washings and aspirates are unacceptable for Xpert Xpress SARS-CoV-2/FLU/RSV testing.  Fact Sheet for Patients: EntrepreneurPulse.com.au  Fact Sheet for Healthcare Providers: IncredibleEmployment.be  This test is not yet approved or cleared by the [place] FDA and has been authorized for detection and/or diagnosis of SARS-CoV-2 by FDA under an Emergency Use Authorization (EUA). This EUA will remain in effect (meaning this test can be used) for the  duration of the COVID-19 declaration under Section 564(b)(1) of the Act, 21 U.S.C. section 360bbb-3(b)(1), unless the authorization is terminated or revoked.  Performed at [facility], [place] [place], [place] [address]       Radiology Studies: DG Orbits  Result Date: May 22, 2021 CLINICAL DATA:  Altered mental status.  Clearing for MRI EXAM: ORBITS - COMPLETE 4+ VIEW COMPARISON:  None. FINDINGS: There is no evidence of metallic foreign body within the orbits. No significant bone abnormality identified. IMPRESSION: No evidence of metallic foreign body within the orbits. Electronically Signed   By: Franchot Gallo M.D.   On: May 22, 2021 16:06   DG Chest 1  View  Result Date: 05/22/2021 CLINICAL DATA:  Altered mental status. EXAM: CHEST  1 VIEW COMPARISON:  None. FINDINGS: The heart size and mediastinal contours are within normal limits. Both lungs are clear. The visualized skeletal structures are unremarkable. IMPRESSION: No active disease. Electronically Signed   By: Franchot Gallo M.D.   On: 2021-05-22 16:04   CT Head Wo Contrast  Result Date: 2021-05-22 CLINICAL DATA:  Mental status changes EXAM: CT HEAD WITHOUT CONTRAST TECHNIQUE: Contiguous axial images were obtained from the base of the skull through the vertex without intravenous contrast. COMPARISON:  05/03/2021 FINDINGS: Brain: No evidence of acute infarction, hemorrhage, hydrocephalus, extra-axial collection or mass lesion/mass effect. Periventricular white matter low attenuation as can be seen with microvascular disease which is greater than expected for the patient's age. Bilateral basal ganglia lacunar infarcts. Small lacunar infarct in the left thalamus. Vascular: No hyperdense vessel or unexpected calcification. Skull: No osseous abnormality. Sinuses/Orbits: Visualized paranasal sinuses are clear. Visualized mastoid sinuses are clear. Visualized orbits demonstrate no focal abnormality. Other: None IMPRESSION: 1. No acute intracranial pathology. 2. Periventricular white matter low attenuation as can be seen with microvascular disease which is greater than expected for the patient's age. Bilateral basal ganglia lacunar infarcts. Small lacunar infarct in the left thalamus. Electronically Signed   By: Kathreen Devoid M.D.   On: 2021/05/22 14:03   MR BRAIN WO CONTRAST  Result Date: May 22, 2021 CLINICAL DATA:  Altered mental status, confusion EXAM: MRI HEAD WITHOUT CONTRAST TECHNIQUE: Multiplanar, multiecho pulse sequences of the brain and surrounding structures were obtained without intravenous contrast. COMPARISON:  No prior MRI, correlation is made with CT head 2021/05/22 FINDINGS: Evaluation is  somewhat limited by motion artifact. Brain: No restricted diffusion to suggest acute or subacute infarction. No acute hemorrhage, mass, mass effect, or midline shift. Multiple lacunar infarcts in the bilateral thalami, basal ganglia, and corona radiata. Confluent T2 hyperintense signal in the periventricular white matter and pons, likely the sequela of severe chronic small vessel ischemic disease. Punctate foci of hemosiderin deposition left frontal lobe, right occipital lobe, pons, right thalamus, and left basal ganglia, likely sequela of prior hypertensive microhemorrhages. Vascular: Normal flow voids. Skull and upper cervical spine: Normal marrow signal. Sinuses/Orbits: Negative. Other: None. IMPRESSION: Evaluation is somewhat limited by motion artifact. Within this limitation, no acute intracranial process. Electronically Signed   By: Merilyn Baba M.D.   On: 22-May-2021 18:07   DG Abd 2 Views  Result Date: 2021-05-22 CLINICAL DATA:  Altered Mental status EXAM: ABDOMEN - 2 VIEW COMPARISON:  None. FINDINGS: The bowel gas pattern is normal. There is no evidence of free air. No radio-opaque calculi or other significant radiographic abnormality is seen. IMPRESSION: Negative. Electronically Signed   By: Franchot Gallo M.D.   On: 05/22/2021 16:09       LOS: 0  days   [facility] Pager on [email or web address]  05/07/2021, 10:10 AM

## 2021-05-07 NOTE — TOC Initial Note (Signed)
Transition of Care [facility]) - Initial/Assessment Note    Patient Details  Name: Arthur [name] MRN: [id number] Date of Birth: May 04, 1971  Transition of Care [facility]) CM/SW Contact:    Lawerance Sabal, RN Phone Number: 05/07/2021, 11:28 AM  Clinical Narrative:            Acknowledge consult for homelessness, no PCP, no insurance coverage, chronic medical issues, frequent ED visits in last month.  Continue medical workup at this time. Patient documented as not answering questions appropriately at this time.  Eligible for [facility] and recommend MATCH with [facility] pharmacy fill prior to DC, as well as possible involvement w [facility].  TOC will continue to follow           Barriers to Discharge: Continued Medical Work up, Inadequate or no insurance, Homeless with medical needs   Patient Goals and CMS Choice        Expected Discharge Plan and Services         Living arrangements for the past 2 months: Homeless                                      Prior Living Arrangements/Services Living arrangements for the past 2 months: Homeless                     Activities of Daily Living Home Assistive Devices/Equipment: Cane (specify quad or straight) ADL Screening (condition at time of admission) Patient's cognitive ability adequate to safely complete daily activities?: Yes Is the patient deaf or have difficulty hearing?: No Does the patient have difficulty seeing, even when wearing glasses/contacts?: No Does the patient have difficulty concentrating, remembering, or making decisions?: No Patient able to express need for assistance with ADLs?: Yes Does the patient have difficulty dressing or bathing?: No Independently performs ADLs?: Yes (appropriate for developmental age) Does the patient have difficulty walking or climbing stairs?: No Weakness of Legs: None Weakness of Arms/Hands: None  Permission Sought/Granted                  Emotional Assessment               Admission diagnosis:  Seizures (HCC) [R56.9] Altered mental status [R41.82] Hypertension, unspecified type [I10] Patient Active Problem List   Diagnosis Date Noted   Seizures (HCC) 05/06/2021   HTN (hypertension) 05/06/2021   PCP:  Pcp, No Pharmacy:   [facility] [address] [place] [address] Phone: [phone] Fax: [phone]     Social Determinants of Health (SDOH) Interventions    Readmission Risk Interventions No flowsheet data found.

## 2021-05-07 NOTE — Progress Notes (Signed)
Ltm setup at bedside with CT leads. No skin breakdown noted. Results pending.

## 2021-05-07 NOTE — Progress Notes (Signed)
EEG done at bedside and constantly moving patient that does not follow commands. Impedance good. No skin breakdown noted. Results pending.

## 2021-05-07 NOTE — Consult Note (Signed)
Neurology Consultation Reason for Consult: Concern for seizures Referring Physician: Barnie Del  CC: Seizures  History is obtained from: Patient  HPI: Arthur [name] is a 50 y.o. male with a history of strokes who presents with recurrent episodes of loss of consciousness and unresponsiveness.  He also reports a history of seizures and has been on Depakote in the past, but has not been taking it.  He first presented with episodes of loss of consciousness in 12/6 when he was given a prescription for Depakote and told to follow-up as an outpatient. On 12/7 he then was found in the parking lot of the hospital, And there was some thought that he may have had a seizure.  He does not remember these events.  He then again today had loss of consciousness, though it is not clear to me if he ever had true witnessed convulsive activity.  He was brought into the emergency department and an MRI was performed which was negative.  Due to these recurrent episodes of unclear etiology, he has been transferred to [facility] for consideration of EEG.   ROS:  Unable to obtain due to limited responses from the patient  Past Medical History:  Diagnosis Date   Homeless    Stroke [facility])      Family History  Problem Relation Age of Onset   Seizures Neg Hx      Social History:  reports that he has been smoking cigarettes. He does not have any smokeless tobacco history on file. He reports that he does not currently use alcohol. He reports that he does not currently use drugs.   Exam: Current vital signs: BP (!) 136/93 (BP Location: Left Arm)   Pulse (!) 51   Temp 98.2 F (36.8 C) (Oral) Comment: refused  Resp 20   SpO2 100%  Vital signs in last 24 hours: Temp:  [97 F (36.1 C)-98.2 F (36.8 C)] 98.2 F (36.8 C) (12/10 0354) Pulse Rate:  [51-93] 51 (12/10 0354) Resp:  [16-21] 20 (12/10 0354) BP: (136-192)/(87-118) 136/93 (12/10 0354) SpO2:  [98 %-100 %] 100 % (12/10 0354)   Physical Exam   Constitutional: Appears well-developed and well-nourished.  Psych: Affect appropriate to situation Eyes: No scleral injection HENT: No OP obstruction MSK: no joint deformities.  Cardiovascular: Normal rate and regular rhythm.  Respiratory: Effort normal, non-labored breathing GI: Soft.  No distension. There is no tenderness.  Skin: Multiple tattoos, healing abrasions next to the left eye and left upper lip  Neuro: Mental Status: Patient is awake, but initially does not respond to me verbally.  When I wait, he eventually does begin responding and is slightly dysarthric.  He is able to tell me what month it is, where he is, but states he does not remember any of the events leading to the hospitalization. Cranial Nerves: II: Visual Fields are full. Pupils are equal, round, and reactive to light.   III,IV, VI: EOMI without ptosis or diploplia.  V: Facial sensation is symmetric to temperature VII: Facial movement is symmetric.  VIII: hearing is intact to voice X: Uvula elevates symmetrically XI: Shoulder shrug is symmetric. XII: tongue is midline without atrophy or fasciculations.  Motor: Tone is normal. Bulk is normal. 5/5 strength was present in all four extremities.  Sensory: Sensation is symmetric to light touch and temperature in the arms and legs. Cerebellar: No clear ataxia on finger-nose-finger     I have reviewed labs in epic and the results pertinent to this consultation are: UDS-negative  I have reviewed the images obtained: MRI brain-extensive small vessel insults bilaterally  Impression: 50 year old male who presents with recurrent episodes of decreased responsiveness of unclear etiology.  Seizures are certainly a consideration, but I do think I would favor further characterization of the spells.  Recommendations: 1) overnight EEG 2) continue home Depakote 500 twice daily 3) neurology will continue to follow   Ritta Slot, MD [facility] [phone]  If 7pm- 7am, please page neurology on call as listed in AMION.

## 2021-05-08 DIAGNOSIS — E876 Hypokalemia: Secondary | ICD-10-CM

## 2021-05-08 LAB — COMPREHENSIVE METABOLIC PANEL
ALT: 26 U/L (ref 0–44)
AST: 25 U/L (ref 15–41)
Albumin: 2.9 g/dL — ABNORMAL LOW (ref 3.5–5.0)
Alkaline Phosphatase: 56 U/L (ref 38–126)
Anion gap: 9 (ref 5–15)
BUN: 6 mg/dL (ref 6–20)
CO2: 24 mmol/L (ref 22–32)
Calcium: 8.4 mg/dL — ABNORMAL LOW (ref 8.9–10.3)
Chloride: 104 mmol/L (ref 98–111)
Creatinine, Ser: 0.91 mg/dL (ref 0.61–1.24)
GFR, Estimated: >60 mL/min (ref >60)
Glucose, Bld: 97 mg/dL (ref 70–99)
Potassium: 2.8 mmol/L — ABNORMAL LOW (ref 3.5–5.1)
Sodium: 137 mmol/L (ref 135–145)
Total Bilirubin: 0.8 mg/dL (ref 0.3–1.2)
Total Protein: 5.3 g/dL — ABNORMAL LOW (ref 6.5–8.1)

## 2021-05-08 LAB — LIPID PANEL
Cholesterol: 118 mg/dL (ref 0–200)
HDL: 28 mg/dL — ABNORMAL LOW (ref >40)
LDL Cholesterol: 71 mg/dL (ref 0–99)
Total CHOL/HDL Ratio: 4.2 RATIO
Triglycerides: 97 mg/dL (ref <150)
VLDL: 19 mg/dL (ref 0–40)

## 2021-05-08 LAB — CBC
HCT: 34.7 % — ABNORMAL LOW (ref 39.0–52.0)
Hemoglobin: 12 g/dL — ABNORMAL LOW (ref 13.0–17.0)
MCH: 29.3 pg (ref 26.0–34.0)
MCHC: 34.6 g/dL (ref 30.0–36.0)
MCV: 84.6 fL (ref 80.0–100.0)
Platelets: 212 10*3/uL (ref 150–400)
RBC: 4.1 MIL/uL — ABNORMAL LOW (ref 4.22–5.81)
RDW: 12.6 % (ref 11.5–15.5)
WBC: 6.9 10*3/uL (ref 4.0–10.5)
nRBC: 0 % (ref 0.0–0.2)

## 2021-05-08 LAB — MAGNESIUM: Magnesium: 1.6 mg/dL — ABNORMAL LOW (ref 1.7–2.4)

## 2021-05-08 LAB — TSH: TSH: <0.01 u[IU]/mL — ABNORMAL LOW (ref 0.350–4.500)

## 2021-05-08 MED ORDER — MAGNESIUM SULFATE 2 GM/50ML IV SOLN
2.0000 g | Freq: Once | INTRAVENOUS | Status: AC
  Administered 2021-05-08: 2 g via INTRAVENOUS
  Filled 2021-05-08: qty 50

## 2021-05-08 MED ORDER — POTASSIUM CHLORIDE 20 MEQ PO PACK
40.0000 meq | PACK | ORAL | Status: AC
  Administered 2021-05-08 (×2): 40 meq via ORAL
  Filled 2021-05-08 (×2): qty 2

## 2021-05-08 MED ORDER — ATORVASTATIN CALCIUM 10 MG PO TABS
10.0000 mg | ORAL_TABLET | Freq: Every day | ORAL | Status: DC
  Administered 2021-05-08 – 2021-05-14 (×7): 10 mg via ORAL
  Filled 2021-05-08 (×8): qty 1

## 2021-05-08 MED ORDER — ASPIRIN EC 81 MG PO TBEC
81.0000 mg | DELAYED_RELEASE_TABLET | Freq: Every day | ORAL | Status: DC
  Administered 2021-05-08 – 2021-05-14 (×7): 81 mg via ORAL
  Filled 2021-05-08 (×8): qty 1

## 2021-05-08 MED ORDER — AMLODIPINE BESYLATE 10 MG PO TABS
10.0000 mg | ORAL_TABLET | Freq: Every day | ORAL | Status: DC
  Administered 2021-05-08 – 2021-05-14 (×7): 10 mg via ORAL
  Filled 2021-05-08 (×8): qty 1

## 2021-05-08 NOTE — Procedures (Addendum)
Patient Name: Arthur [name]  MRN: [id number]  Epilepsy Attending: Charlsie Quest  Referring Physician/Provider: Dr Ritta Slot Duration: 05/07/2021 1532 to 05/08/2021 1532  Patient history:  50 y.o. male with a history of strokes and seizures who is undergoing an EEG to evaluate for seizures  Level of alertness: Awake, asleep  AEDs during EEG study: VPA  Technical aspects: This EEG study was done with scalp electrodes positioned according to the 10-20 International system of electrode placement. Electrical activity was acquired at a sampling rate of 500Hz  and reviewed with a high frequency filter of 70Hz  and a low frequency filter of 1Hz . EEG data were recorded continuously and digitally stored.   Description: The posterior dominant rhythm consists of 8-9 Hz activity of moderate voltage (25-35 uV) seen predominantly in posterior head regions, symmetric and reactive to eye opening and eye closing. Sleep was characterized by vertex waves, sleep spindles (12 to 14 Hz), maximal frontocentral region. EEG showed intermittent generalized 3 to 6 Hz theta-delta slowing. Hyperventilation and photic stimulation were not performed.     Of note, study was difficult to interpret after around 2230 on 05/07/21 due to significant electrode artifact.   ABNORMALITY - Intermittent slow, generalized  IMPRESSION: This technically difficult study is suggestive of mild diffuse encephalopathy, nonspecific etiology. No seizures or epileptiform discharges were seen throughout the recording.  Of note, study was difficult to interpret after around 2230 on 05/07/21 due to significant electrode artifact.   Creedon Danielski 14/10/22

## 2021-05-08 NOTE — Progress Notes (Signed)
LTM mainatain done/ Patient had removed all leads. Tech repplied and wrapped. No skin breakdown seen. Patient is now restrained for test. Results pending.

## 2021-05-08 NOTE — Progress Notes (Signed)
Pt removed EEG electrodes. He will need restraints per EEG tech, Bonita Quin, before reattaching the electrodes.

## 2021-05-08 NOTE — Progress Notes (Signed)
Patient currently disoriented and removed all electrodes from his head. EEG distorted. RN notified.  RN to request order for restraints before reapplying leads to patients head.

## 2021-05-08 NOTE — Progress Notes (Signed)
Neurology Progress Note  S: States he is doing OK. Denies pain, including HA.   O: Current vital signs: BP (!) 172/123 (BP Location: Right Arm)   Pulse 66   Temp 98.1 F (36.7 C) (Oral)   Resp 18   SpO2 100%  Vital signs in last 24 hours: Temp:  [98.1 F (36.7 C)-98.7 F (37.1 C)] 98.1 F (36.7 C) (12/11 0735) Pulse Rate:  [66-81] 66 (12/11 0735) Resp:  [18-20] 18 (12/11 0735) BP: (163-190)/(94-123) 172/123 (12/11 0735) SpO2:  [97 %-100 %] 100 % (12/11 0735)  GENERAL: Fairly well, disheveled appearing male. Awake, alert in NAD. HEENT: Normocephalic and atraumatic. Most of EEG leads off. Abrasion at OS.  LUNGS: Normal respiratory effort.  CV: RRR on tele.  Ext: warm.  NEURO:  Mental Status: Solemn, quiet, but will participate in exam when asked. Oriented to name, place, year. Disoriented to month (February), day, date. He knows Christmas is next holiday. Follows commands.   Speech/Language: speech is without aphasia or dysarthria.  Naming, repetition, fluency, and comprehension intact.  Cranial Nerves:  PERRL. His left eyelid is closed more than right, but with coaching he opens his left eye symmetrically. Sensation is intact to light touch and symmetrical to face. Smile is symmetrical. Hearing intact to voice. Phonation is normal. Tongue is midline without fasciculations. Motor:  5/5 throughout.  Tone: is normal and bulk is normal. Sensation- Intact to light touch bilaterally.  Coordination: FTN intact bilaterally. Moves LEs in coordinated fashion.   DTRs:  2+ throughout.  Gait- deferred-seizure precautions.  Medications  Current Facility-Administered Medications:    0.9 %  sodium chloride infusion, 75 mL/hr, Intravenous, Continuous, [name], Jared M, DO, Last Rate: 75 mL/hr at 05/08/21 0500, 75 mL/hr at 05/08/21 0500   acetaminophen (TYLENOL) tablet 650 mg, 650 mg, Oral, Q4H PRN **OR** acetaminophen (TYLENOL) suppository 650 mg, 650 mg, Rectal, Q4H PRN, [name], Jared  M, DO   amLODipine (NORVASC) tablet 10 mg, 10 mg, Oral, Daily, Krishnan, Gokul, MD   chlorhexidine gluconate (MEDLINE KIT) (PERIDEX) 0.12 % solution 15 mL, 15 mL, Mouth Rinse, BID, [name], Jared M, DO, 15 mL at 05/07/21 2149   dextrose (GLUTOSE) 40 % oral gel 31 g, 1 Tube, Oral, PRN, Butler, Michael C, MD, 31 g at 05/06/21 1457   divalproex (DEPAKOTE) DR tablet 500 mg, 500 mg, Oral, BID, [name], Jared M, DO, 500 mg at 05/07/21 2148   enoxaparin (LOVENOX) injection 40 mg, 40 mg, Subcutaneous, Q24H, [name], Jared M, DO, 40 mg at 05/07/21 2148   feeding supplement (ENSURE ENLIVE / ENSURE PLUS) liquid 237 mL, 237 mL, Oral, BID BM, [name], Jared M, DO, 237 mL at 05/07/21 1652   labetalol (NORMODYNE) injection 10-20 mg, 10-20 mg, Intravenous, Q2H PRN, [name], Jared M, DO   magnesium sulfate IVPB 2 g 50 mL, 2 g, Intravenous, Once, Krishnan, Gokul, MD   MEDLINE mouth rinse, 15 mL, Mouth Rinse, 10 times per day, [name], Jared M, DO, 15 mL at 05/07/21 2149   ondansetron (ZOFRAN) tablet 4 mg, 4 mg, Oral, Q6H PRN **OR** ondansetron (ZOFRAN) injection 4 mg, 4 mg, Intravenous, Q6H PRN, [name], Jared M, DO   potassium chloride (KLOR-CON) packet 40 mEq, 40 mEq, Oral, Q4H, Krishnan, Gokul, MD  Pertinent Labs K 2.8. Mg 1.6. TSH < 0.010. ETOH < 10. UDS negative. LDL 71.   Imaging MD has reviewed images in epic and the results pertinent to this consultation are:  CT Head 1. No acute intracranial pathology. 2. Periventricular white   matter low attenuation as can be seen with microvascular disease which is greater than expected for the patient's age. Bilateral basal ganglia lacunar infarcts. Small lacunar infarct in the left thalamus.  MRI Brain  Evaluation is somewhat limited by motion artifact. Within this limitation, no acute intracranial process.  EEG Impression and clinical correlation: This EEG was obtained while awake and asleep and is abnormal due to mild diffuse slowing indicative of global  cerebral dysfunction as well as focal bifrontal slowing indicating superimposed focal cerebral dysfunction in these areas. Epileptiform abnormalities were not seen during this recording. Patient will be monitored on cEEG overnight.  Assessment:  50 year old male who presents with recurrent episodes of decreased responsiveness of unclear etiology. We are searching for EEG correlation to a spell. His rEEG shows diffuse cerebral dysfunction but no epileptiform discharges. Unfortunately, patient pulled off most of his cEEG leads during night, so we have no read as of yet. No new strokes on imaging.   Impression: -episodes of LOC of unknown etiology.  -History of multiple ED visits with seizure like activity.   Recommendations/Plan:  -replace leads for cEEG. We will follow.  -restraints while cEEG leads are on.  -continue home Depakote 500 twice daily  Pt seen by Clance Boll, MSN, APN-BC/Nurse Practitioner/Neuro and later by MD. Note and plan to be edited as needed by MD.  Pager: [phone]   NEUROHOSPITALIST ADDENDUM Performed a face to face diagnostic evaluation.   I have reviewed the contents of history and physical exam as documented by PA/ARNP/Resident and agree with above documentation.  I have discussed and formulated the above plan as documented. Edits to the note have been made as needed.  Impression/Key exam finding/Plan: Placed on cEEG but took almost all the EEG leads off in the night around 2230. Will need to be hooked back up after mitts and soft restraints are in place. Unfortunately, he has not had any events yet. He is awake, alert, talkative and cooperative with exam with no focal deficit.  He reports that he cant afford depakote and thus not taking it. Will see if we can have social work assist him. Depakote is relatively inexpensive compared to other medications.  Donnetta Simpers, MD [facility] [phone]   If 7pm to 7am, please call on call as  listed on AMION.

## 2021-05-08 NOTE — Progress Notes (Addendum)
[facility] PROGRESS NOTE   Arthur [name] OIN:[id number] DOB: 1971/02/17 DOA: 05/06/2021  PCP: Pcp, No  Brief History/Interval Summary: 50 year old Caucasian male with a past medical history of stroke and possibly seizure disorder who has had 3 visits to the emergency department in 3 days with seizure activity.  Subsequently hospitalized for further management.  Reason for Visit: Seizures  Consultants: Neurology  Procedures: EEG    Subjective/Interval History: Patient seems to be more responsive today.  Answering questions more appropriately.  Denies any headaches.     Assessment/Plan:  Seizure disorder with acute metabolic encephalopathy MRI brain was negative for acute findings.  Chronic infarcts were noted.  Patient was started on Depakote.  Neurology is following.  Patient on continuous EEG.  Remains on Depakote.  Mentation seems to be better today.   Metabolic acidosis likely from seizure activity.  Noted to be normal today.    History of stroke Chronic infarcts noted on MRI brain.  Will initiate aspirin.  LDL is 71.  Will initiate statin.  Elevated blood pressure, likely essential hypertension No known history of hypertension.  Not noted to be on any antihypertensives prior to admission.  Blood pressure remains poorly controlled.  Will increase dose of amlodipine.  Labetalol as needed.  May need to add additional agents.    Hypokalemia and hypomagnesemia This will be repleted.  Abnormal TSH Will check free T4 tomorrow morning.  Normocytic anemia No overt bleeding noted.  Check anemia panel.  Homelessness Patient is homeless.  Will involve TOC.  Urine drug screen unremarkable.  Alcohol level less than 10.  Minimally displaced fracture of the left nasal bone This is secondary to fall he sustained after seizure several days ago.  He also has soft tissue contusion of the left cheek and chin.  Outpatient follow-up with ENT.   DVT Prophylaxis:  Lovenox Code Status: Full code Family Communication: No family at bedside Disposition Plan: Patient is homeless  Status is: Inpatient  Remains inpatient appropriate because: Seizure activity       Medications: Scheduled:  amLODipine  10 mg Oral Daily   chlorhexidine gluconate (MEDLINE KIT)  15 mL Mouth Rinse BID   divalproex  500 mg Oral BID   enoxaparin (LOVENOX) injection  40 mg Subcutaneous Q24H   feeding supplement  237 mL Oral BID BM   mouth rinse  15 mL Mouth Rinse 10 times per day   potassium chloride  40 mEq Oral Q4H   Continuous:  sodium chloride 75 mL/hr (05/08/21 0500)   magnesium sulfate bolus IVPB 2 g (05/08/21 0908)   BSJ:GGEZMOQHUTMLY **OR** acetaminophen, dextrose, labetalol, ondansetron **OR** ondansetron (ZOFRAN) IV  Antibiotics: Anti-infectives (From admission, onward)    None       Objective:  Vital Signs  Vitals:   05/07/21 2201 05/07/21 2350 05/08/21 0325 05/08/21 0735  BP: (!) 175/107 (!) 183/108 (!) 190/121 (!) 172/123  Pulse:  71 72 66  Resp:  _0 Temp:  98.6 F (37 C) 98.1 F (36.7 C) 98.1 F (36.7 C)  TempSrc:  Oral Oral Oral  SpO2:  99% 99% 100%    Intake/Output Summary (Last 24 hours) at 05/08/2021 6503 Last data filed at 05/08/2021 0646 Gross per 24 hour  Intake 1200 ml  Output 1225 ml  Net -25 ml    There were no vitals filed for this visit.  General appearance: Much more awake and alert today.  More responsive. Resp: Clear to auscultation bilaterally.  Normal effort Cardio: S1-S2  is normal regular.  No S3-S4.  No rubs murmurs or bruit GI: Abdomen is soft.  Nontender nondistended.  Bowel sounds are present normal.  No masses organomegaly Extremities: Bruising noted over both of his lower extremities although he does have good range of motion. Neurologic: Answers all of my orientation questions.  No obvious focal neurological deficits noted     Lab Results:  Data Reviewed: I have personally reviewed  following labs and imaging studies  CBC: Recent Labs  Lab 05/03/21 0922 05/04/21 1448 05/06/21 1100 05/08/21 0222  WBC 11.7* 8.1 8.1 6.9  NEUTROABS 9.8* 5.2 6.8  --   HGB 15.0 13.6 13.7 12.0*  HCT 43.5 39.2 40.1 34.7*  MCV 86.3 85.8 87.6 84.6  PLT 240 212 225 212     Basic Metabolic Panel: Recent Labs  Lab 05/03/21 0922 05/04/21 1448 05/06/21 1100 05/08/21 0222  NA 138 136 140 137  K 3.6 3.5 3.8 2.8*  CL 104 105 106 104  CO2 17* 18* 15* 24  GLUCOSE 65* 83 73 97  BUN 24* 25* 23* 6  CREATININE 1.31* 1.25* 1.06 0.91  CALCIUM 9.4 9.2 9.0 8.4*  MG  --   --   --  1.6*     GFR: Estimated Creatinine Clearance: 120.6 mL/min (by C-G formula based on SCr of 0.91 mg/dL).  Liver Function Tests: Recent Labs  Lab 05/03/21 0922 05/04/21 1448 05/08/21 0222  AST 33 34 25  ALT _0 ALKPHOS 87 72 56  BILITOT 1.8* 1.6* 0.8  PROT 7.8 6.8 5.3*  ALBUMIN 4.4 4.0 2.9*       CBG: Recent Labs  Lab 05/06/21 2157 05/06/21 2350 05/07/21 0310 05/07/21 0405 05/07/21 0734  GLUCAP 85 95 83 93 73       Recent Results (from the past 240 hour(s))  Resp Panel by RT-PCR (Flu A&B, Covid) Nasopharyngeal Swab     Status: None   Collection Time: 05/06/21  5:06 PM   Specimen: Nasopharyngeal Swab; Nasopharyngeal(NP) swabs in vial transport medium  Result Value Ref Range Status   SARS Coronavirus 2 by RT PCR NEGATIVE NEGATIVE Final    Comment: (NOTE) SARS-CoV-2 target nucleic acids are NOT DETECTED.  The SARS-CoV-2 RNA is generally detectable in upper respiratory specimens during the acute phase of infection. The lowest concentration of SARS-CoV-2 viral copies this assay can detect is 138 copies/mL. A negative result does not preclude SARS-Cov-2 infection and should not be used as the sole basis for treatment or other patient management decisions. A negative result may occur with  improper specimen collection/handling, submission of specimen other than nasopharyngeal  swab, presence of viral mutation(s) within the areas targeted by this assay, and inadequate number of viral copies(<138 copies/mL). A negative result must be combined with clinical observations, patient history, and epidemiological information. The expected result is Negative.  Fact Sheet for Patients:  EntrepreneurPulse.com.au  Fact Sheet for Healthcare Providers:  IncredibleEmployment.be  This test is no t yet approved or cleared by the [place] FDA and  has been authorized for detection and/or diagnosis of SARS-CoV-2 by FDA under an Emergency Use Authorization (EUA). This EUA will remain  in effect (meaning this test can be used) for the duration of the COVID-19 declaration under Section 564(b)(1) of the Act, 21 U.S.C.section 360bbb-3(b)(1), unless the authorization is terminated  or revoked sooner.       Influenza A by PCR NEGATIVE NEGATIVE Final   Influenza B by PCR NEGATIVE NEGATIVE Final  Comment: (NOTE) The Xpert Xpress SARS-CoV-2/FLU/RSV plus assay is intended as an aid in the diagnosis of influenza from Nasopharyngeal swab specimens and should not be used as a sole basis for treatment. Nasal washings and aspirates are unacceptable for Xpert Xpress SARS-CoV-2/FLU/RSV testing.  Fact Sheet for Patients: EntrepreneurPulse.com.au  Fact Sheet for Healthcare Providers: IncredibleEmployment.be  This test is not yet approved or cleared by the [place] FDA and has been authorized for detection and/or diagnosis of SARS-CoV-2 by FDA under an Emergency Use Authorization (EUA). This EUA will remain in effect (meaning this test can be used) for the duration of the COVID-19 declaration under Section 564(b)(1) of the Act, 21 U.S.C. section 360bbb-3(b)(1), unless the authorization is terminated or revoked.  Performed at [facility], [place] [address] [place], [place] [address]         Radiology Studies: DG Orbits  Result Date: 2021/05/29 CLINICAL DATA:  Altered mental status.  Clearing for MRI EXAM: ORBITS - COMPLETE 4+ VIEW COMPARISON:  None. FINDINGS: There is no evidence of metallic foreign body within the orbits. No significant bone abnormality identified. IMPRESSION: No evidence of metallic foreign body within the orbits. Electronically Signed   By: Franchot Gallo M.D.   On: 2021/05/29 16:06   DG Chest 1 View  Result Date: 05-29-21 CLINICAL DATA:  Altered mental status. EXAM: CHEST  1 VIEW COMPARISON:  None. FINDINGS: The heart size and mediastinal contours are within normal limits. Both lungs are clear. The visualized skeletal structures are unremarkable. IMPRESSION: No active disease. Electronically Signed   By: Franchot Gallo M.D.   On: 2021-05-29 16:04   CT Head Wo Contrast  Result Date: 05/29/2021 CLINICAL DATA:  Mental status changes EXAM: CT HEAD WITHOUT CONTRAST TECHNIQUE: Contiguous axial images were obtained from the base of the skull through the vertex without intravenous contrast. COMPARISON:  05/03/2021 FINDINGS: Brain: No evidence of acute infarction, hemorrhage, hydrocephalus, extra-axial collection or mass lesion/mass effect. Periventricular white matter low attenuation as can be seen with microvascular disease which is greater than expected for the patient's age. Bilateral basal ganglia lacunar infarcts. Small lacunar infarct in the left thalamus. Vascular: No hyperdense vessel or unexpected calcification. Skull: No osseous abnormality. Sinuses/Orbits: Visualized paranasal sinuses are clear. Visualized mastoid sinuses are clear. Visualized orbits demonstrate no focal abnormality. Other: None IMPRESSION: 1. No acute intracranial pathology. 2. Periventricular white matter low attenuation as can be seen with microvascular disease which is greater than expected for the patient's age. Bilateral basal ganglia lacunar infarcts. Small lacunar infarct in the  left thalamus. Electronically Signed   By: Kathreen Devoid M.D.   On: 2021-05-29 14:03   MR BRAIN WO CONTRAST  Result Date: 05-29-2021 CLINICAL DATA:  Altered mental status, confusion EXAM: MRI HEAD WITHOUT CONTRAST TECHNIQUE: Multiplanar, multiecho pulse sequences of the brain and surrounding structures were obtained without intravenous contrast. COMPARISON:  No prior MRI, correlation is made with CT head 2021-05-29 FINDINGS: Evaluation is somewhat limited by motion artifact. Brain: No restricted diffusion to suggest acute or subacute infarction. No acute hemorrhage, mass, mass effect, or midline shift. Multiple lacunar infarcts in the bilateral thalami, basal ganglia, and corona radiata. Confluent T2 hyperintense signal in the periventricular white matter and pons, likely the sequela of severe chronic small vessel ischemic disease. Punctate foci of hemosiderin deposition left frontal lobe, right occipital lobe, pons, right thalamus, and left basal ganglia, likely sequela of prior hypertensive microhemorrhages. Vascular: Normal flow voids. Skull and upper cervical spine: Normal marrow signal. Sinuses/Orbits:  Negative. Other: None. IMPRESSION: Evaluation is somewhat limited by motion artifact. Within this limitation, no acute intracranial process. Electronically Signed   By: Merilyn Baba M.D.   On: 05/06/2021 18:07   DG Abd 2 Views  Result Date: 05/06/2021 CLINICAL DATA:  Altered Mental status EXAM: ABDOMEN - 2 VIEW COMPARISON:  None. FINDINGS: The bowel gas pattern is normal. There is no evidence of free air. No radio-opaque calculi or other significant radiographic abnormality is seen. IMPRESSION: Negative. Electronically Signed   By: Franchot Gallo M.D.   On: 05/06/2021 16:09   EEG adult  Result Date: 05/07/2021 Derek Jack, MD     05/07/2021  8:36 PM Routine EEG Report Arthur [name] is a 50 y.o. male with a history of strokes and seizures who is undergoing an EEG to evaluate for seizures.  Report: This EEG was acquired with electrodes placed according to the International 10-20 electrode system (including Fp1, Fp2, F3, F4, C3, C4, P3, P4, O1, O2, T3, T4, T5, T6, A1, A2, Fz, Cz, Pz). The following electrodes were missing or displaced: none. The occipital dominant rhythm was 7-8 Hz. This activity is reactive to stimulation. Drowsiness was manifested by background fragmentation; deeper stages of sleep were identified by K complexes and sleep spindles. There was superimposed bifrontal focal slowing. There were no interictal epileptiform discharges. There were no electrographic seizures identified. Photic stimulation and hyperventilation were not performed. Impression and clinical correlation: This EEG was obtained while awake and asleep and is abnormal due to mild diffuse slowing indicative of global cerebral dysfunction as well as focal bifrontal slowing indicating superimposed focal cerebral dysfunction in these areas. Epileptiform abnormalities were not seen during this recording. Patient will be monitored on cEEG overnight. Su Monks, MD [facility] [phone] If 7pm- 7am, please page neurology on call as listed in [place].   Overnight EEG with video  Result Date: 05/08/2021 Lora Havens, MD     05/08/2021  8:53 AM Patient Name: Arthur [name] MRN: [id number] Epilepsy Attending: Lora Havens Referring Physician/Provider: Dr Roland Rack Duration: 05/07/2021 1532 to 05/08/2021 0845 Patient history:  50 y.o. male with a history of strokes and seizures who is undergoing an EEG to evaluate for seizures Level of alertness: Awake, asleep AEDs during EEG study: VPA Technical aspects: This EEG study was done with scalp electrodes positioned according to the 10-20 International system of electrode placement. Electrical activity was acquired at a sampling rate of _0  and reviewed with a high frequency filter of _1  and a low frequency filter of _2 . EEG data were  recorded continuously and digitally stored. Description: The posterior dominant rhythm consists of 8-9 Hz activity of moderate voltage (25-35 uV) seen predominantly in posterior head regions, symmetric and reactive to eye opening and eye closing. Sleep was characterized by vertex waves, sleep spindles (12 to 14 Hz), maximal frontocentral region. EEG showed intermittent generalized 3 to 6 Hz theta-delta slowing. Hyperventilation and photic stimulation were not performed.   Of note, study was difficult to interpret after around 2230 on 05/07/21 due to significant electrode artifact. ABNORMALITY - Intermittent slow, generalized IMPRESSION: This technically difficult study is suggestive of mild diffuse encephalopathy, nonspecific etiology. No seizures or epileptiform discharges were seen throughout the recording. Of note, study was difficult to interpret after around 2230 on 05/07/21 due to significant electrode artifact. [place]       LOS: 1 day   Jeremie Abdelaziz [employer] on [email or web address]  05/08/2021, 9:22 AM

## 2021-05-09 DIAGNOSIS — E440 Moderate protein-calorie malnutrition: Secondary | ICD-10-CM | POA: Insufficient documentation

## 2021-05-09 LAB — BASIC METABOLIC PANEL
Anion gap: 10 (ref 5–15)
BUN: 11 mg/dL (ref 6–20)
CO2: 20 mmol/L — ABNORMAL LOW (ref 22–32)
Calcium: 8.9 mg/dL (ref 8.9–10.3)
Chloride: 107 mmol/L (ref 98–111)
Creatinine, Ser: 0.97 mg/dL (ref 0.61–1.24)
GFR, Estimated: >60 mL/min (ref >60)
Glucose, Bld: 101 mg/dL — ABNORMAL HIGH (ref 70–99)
Potassium: 3.4 mmol/L — ABNORMAL LOW (ref 3.5–5.1)
Sodium: 137 mmol/L (ref 135–145)

## 2021-05-09 LAB — RETICULOCYTES
Immature Retic Fract: 6.3 % (ref 2.3–15.9)
RBC.: 4.43 MIL/uL (ref 4.22–5.81)
Retic Count, Absolute: 43.9 10*3/uL (ref 19.0–186.0)
Retic Ct Pct: 1 % (ref 0.4–3.1)

## 2021-05-09 LAB — FOLATE: Folate: 7.1 ng/mL (ref >5.9)

## 2021-05-09 LAB — MAGNESIUM: Magnesium: 1.9 mg/dL (ref 1.7–2.4)

## 2021-05-09 LAB — FERRITIN: Ferritin: 386 ng/mL — ABNORMAL HIGH (ref 24–336)

## 2021-05-09 LAB — IRON AND TIBC
Iron: 49 ug/dL (ref 45–182)
Saturation Ratios: 22 % (ref 17.9–39.5)
TIBC: 223 ug/dL — ABNORMAL LOW (ref 250–450)
UIBC: 174 ug/dL

## 2021-05-09 LAB — VITAMIN B12: Vitamin B-12: 408 pg/mL (ref 180–914)

## 2021-05-09 LAB — T4, FREE: Free T4: 2.74 ng/dL — ABNORMAL HIGH (ref 0.61–1.12)

## 2021-05-09 MED ORDER — ADULT MULTIVITAMIN W/MINERALS CH
1.0000 | ORAL_TABLET | Freq: Every day | ORAL | Status: DC
  Administered 2021-05-10 – 2021-05-13 (×4): 1 via ORAL
  Filled 2021-05-09 (×5): qty 1

## 2021-05-09 MED ORDER — PROPRANOLOL HCL 20 MG PO TABS
20.0000 mg | ORAL_TABLET | Freq: Two times a day (BID) | ORAL | Status: DC
  Administered 2021-05-09 (×2): 20 mg via ORAL
  Filled 2021-05-09 (×3): qty 1

## 2021-05-09 MED ORDER — ENSURE ENLIVE PO LIQD
237.0000 mL | Freq: Three times a day (TID) | ORAL | Status: DC
  Administered 2021-05-09 – 2021-05-13 (×11): 237 mL via ORAL

## 2021-05-09 MED ORDER — POTASSIUM CHLORIDE 20 MEQ PO PACK
40.0000 meq | PACK | Freq: Once | ORAL | Status: AC
  Administered 2021-05-09: 40 meq via ORAL
  Filled 2021-05-09: qty 2

## 2021-05-09 NOTE — TOC Initial Note (Signed)
Transition of Care [facility]) - Initial/Assessment Note    Patient Details  Name: Arthur [name] MRN: [id number] Date of Birth: 11/01/1970  [facility] CM/SW Contact:    Pollie Friar, RN Phone Number: 05/09/2021, 12:54 PM  Clinical Narrative:                 CM met with the patient and he states he lives in a house with friends. He states he was not taking any medications prior to admission. He has medicare A&B but doesn't seem to have any medication coverage.  CM inquired about a PCP and would he be able to get to see a MD outside the hospital and he says he will be able to make an appointment if scheduled.  CM will obtain a PCP appt prior to d/c. He can also use [facility] pharmacy for his medications after d/c for assistance.  CM will also arrange him with [facility] if he needs it.  TOC following.  Expected Discharge Plan: Homeless Shelter Barriers to Discharge: Continued Medical Work up   Patient Goals and CMS Choice        Expected Discharge Plan and Services Expected Discharge Plan: Homeless Shelter   Discharge Planning Services: CM Consult   Living arrangements for the past 2 months: Homeless                                      Prior Living Arrangements/Services Living arrangements for the past 2 months: Homeless   Patient language and need for interpreter reviewed:: Yes Do you feel safe going back to the place where you live?: Yes            Criminal Activity/Legal Involvement Pertinent to Current Situation/Hospitalization: No - Comment as needed  Activities of Daily Living Home Assistive Devices/Equipment: Cane (specify quad or straight) ADL Screening (condition at time of admission) Patient's cognitive ability adequate to safely complete daily activities?: Yes Is the patient deaf or have difficulty hearing?: No Does the patient have difficulty seeing, even when wearing glasses/contacts?: No Does the patient have difficulty  concentrating, remembering, or making decisions?: No Patient able to express need for assistance with ADLs?: Yes Does the patient have difficulty dressing or bathing?: No Independently performs ADLs?: Yes (appropriate for developmental age) Does the patient have difficulty walking or climbing stairs?: No Weakness of Legs: None Weakness of Arms/Hands: None  Permission Sought/Granted                  Emotional Assessment Appearance:: Appears stated age Attitude/Demeanor/Rapport: Engaged Affect (typically observed): Accepting Orientation: : Oriented to Self, Oriented to Place   Psych Involvement: No (comment)  Admission diagnosis:  Seizures ([place]) [R56.9] Altered mental status [R41.82] Hypertension, unspecified type [I10] Seizure [facility]) [R56.9] Patient Active Problem List   Diagnosis Date Noted   Seizure ([place]) 05/07/2021   Seizures ([place]) 05/06/2021   HTN (hypertension) 05/06/2021   PCP:  Pcp, No Pharmacy:   [facility] [address] [address] Phone: [phone] Fax: [phone]     Social Determinants of Health (SDOH) Interventions    Readmission Risk Interventions No flowsheet data found.

## 2021-05-09 NOTE — Progress Notes (Signed)
Patient fully rehooked to electrodes after he had pulled them off. RN notified. Neuro notified. No skin issues noted.

## 2021-05-09 NOTE — Progress Notes (Signed)
[facility] PROGRESS NOTE   Arthur [name] LFY:[id number] DOB: September 13, 1970 DOA: 05/06/2021  PCP: Pcp, No  Brief History/Interval Summary: 50 year old Caucasian male with a past medical history of stroke and possibly seizure disorder who has had 3 visits to the emergency department in 3 days with seizure activity.  Subsequently hospitalized for further management.  Reason for Visit: Seizures  Consultants: Neurology  Procedures: EEG    Subjective/Interval History: Patient denies any headaches.  No nausea vomiting.  More awake and alert today.     Assessment/Plan:  Seizure disorder with acute metabolic encephalopathy MRI brain was negative for acute findings.  Chronic infarcts were noted.   Neurology is following. Patient on continuous EEG.  Remains on Depakote.  Metabolic acidosis was from seizure activity.  Bicarbonate noted to have improved.  History of stroke Chronic infarcts noted on MRI brain.  Started on aspirin statin.  LDL 71.    Essential hypertension No known history of hypertension.  Not noted to be on any antihypertensives prior to admission.   Amlodipine was initiated and dose was increased yesterday.  Monitor blood pressure trends.     Hypokalemia and hypomagnesemia Potassium level has improved though remains low.  Additional supplements will be provided today.  Magnesium has improved.  Abnormal thyroid function test raising concern for hypothyroidism Patient without any overt signs or symptoms of hyperthyroidism.  Does not have any thyroid enlargement on examination.  TSH noted to be suppressed to less than 0.01.  Free T4 noted to be elevated at 2.74.  Will order free T3 levels.  Will order thyroids stimulating immunoglobulin level. Ideally this patient should be followed by endocrinology/PCP. Will initiate low-dose beta-blocker.  Would be hesitant to initiate antithyroid medications in this patient who is homeless and has a history of  noncompliance.  Normocytic anemia No overt bleeding noted.  Check anemia panel.  Hemoglobin stable.  Anemia panel does not show any clear deficiencies.  Homelessness Patient is homeless.  Will involve TOC.  Urine drug screen unremarkable.  Alcohol level less than 10.  Minimally displaced fracture of the left nasal bone This is secondary to fall he sustained after seizure several days ago.  He also has soft tissue contusion of the left cheek and chin.  Outpatient follow-up with ENT/Plastics as needed.  No need for any intervention based on my discussions with Dr. Marcelline Deist with ENT   DVT Prophylaxis: Lovenox Code Status: Full code Family Communication: No family at bedside Disposition Plan: Patient is homeless  Status is: Inpatient  Remains inpatient appropriate because: Seizure activity       Medications: Scheduled:  amLODipine  10 mg Oral Daily   aspirin EC  81 mg Oral Daily   atorvastatin  10 mg Oral Daily   chlorhexidine gluconate (MEDLINE KIT)  15 mL Mouth Rinse BID   divalproex  500 mg Oral BID   enoxaparin (LOVENOX) injection  40 mg Subcutaneous Q24H   feeding supplement  237 mL Oral BID BM   mouth rinse  15 mL Mouth Rinse 10 times per day   Continuous:  sodium chloride 75 mL/hr (05/09/21 0631)   ENI:DPOEUMPNTIRWE **OR** acetaminophen, dextrose, labetalol, ondansetron **OR** ondansetron (ZOFRAN) IV  Antibiotics: Anti-infectives (From admission, onward)    None       Objective:  Vital Signs  Vitals:   05/08/21 1950 05/09/21 0009 05/09/21 0055 05/09/21 0428  BP: (!) 119/99 (!) 170/121 (!) 149/102 (!) 148/102  Pulse: 73 77  84  Resp: _0 Temp:  98.6 F (37 C) 98.4 F (36.9 C)  97.9 F (36.6 C)  TempSrc: Oral Oral  Oral  SpO2: 100% 97%  99%    Intake/Output Summary (Last 24 hours) at 05/09/2021 1035 Last data filed at 05/09/2021 0631 Gross per 24 hour  Intake 2462.34 ml  Output 1700 ml  Net 762.34 ml    There were no vitals filed for  this visit.   General appearance: Awake alert.  In no distress Resp: Clear to auscultation bilaterally.  Normal effort Cardio: S1-S2 is normal regular.  No S3-S4.  No rubs murmurs or bruit GI: Abdomen is soft.  Nontender nondistended.  Bowel sounds are present normal.  No masses organomegaly Extremities: No edema.  Full range of motion of lower extremities. Neurologic: No focal neurological deficits.      Lab Results:  Data Reviewed: I have personally reviewed following labs and imaging studies  CBC: Recent Labs  Lab 05/03/21 0922 05/04/21 1448 05/06/21 1100 05/08/21 0222  WBC 11.7* 8.1 8.1 6.9  NEUTROABS 9.8* 5.2 6.8  --   HGB 15.0 13.6 13.7 12.0*  HCT 43.5 39.2 40.1 34.7*  MCV 86.3 85.8 87.6 84.6  PLT 240 212 225 212     Basic Metabolic Panel: Recent Labs  Lab 05/03/21 0922 05/04/21 1448 05/06/21 1100 05/08/21 0222 05/09/21 0254  NA 138 136 140 137 137  K 3.6 3.5 3.8 2.8* 3.4*  CL 104 105 106 104 107  CO2 17* 18* 15* 24 20*  GLUCOSE 65* 83 73 97 101*  BUN 24* 25* 23* 6 11  CREATININE 1.31* 1.25* 1.06 0.91 0.97  CALCIUM 9.4 9.2 9.0 8.4* 8.9  MG  --   --   --  1.6* 1.9     GFR: Estimated Creatinine Clearance: 113.1 mL/min (by C-G formula based on SCr of 0.97 mg/dL).  Liver Function Tests: Recent Labs  Lab 05/03/21 0922 05/04/21 1448 05/08/21 0222  AST 33 34 25  ALT _0 ALKPHOS 87 72 56  BILITOT 1.8* 1.6* 0.8  PROT 7.8 6.8 5.3*  ALBUMIN 4.4 4.0 2.9*       CBG: Recent Labs  Lab 05/06/21 2157 05/06/21 2350 05/07/21 0310 05/07/21 0405 05/07/21 0734  GLUCAP 85 95 83 93 73       Recent Results (from the past 240 hour(s))  Resp Panel by RT-PCR (Flu A&B, Covid) Nasopharyngeal Swab     Status: None   Collection Time: 05/06/21  5:06 PM   Specimen: Nasopharyngeal Swab; Nasopharyngeal(NP) swabs in vial transport medium  Result Value Ref Range Status   SARS Coronavirus 2 by RT PCR NEGATIVE NEGATIVE Final    Comment:  (NOTE) SARS-CoV-2 target nucleic acids are NOT DETECTED.  The SARS-CoV-2 RNA is generally detectable in upper respiratory specimens during the acute phase of infection. The lowest concentration of SARS-CoV-2 viral copies this assay can detect is 138 copies/mL. A negative result does not preclude SARS-Cov-2 infection and should not be used as the sole basis for treatment or other patient management decisions. A negative result may occur with  improper specimen collection/handling, submission of specimen other than nasopharyngeal swab, presence of viral mutation(s) within the areas targeted by this assay, and inadequate number of viral copies(<138 copies/mL). A negative result must be combined with clinical observations, patient history, and epidemiological information. The expected result is Negative.  Fact Sheet for Patients:  EntrepreneurPulse.com.au  Fact Sheet for Healthcare Providers:  IncredibleEmployment.be  This test is no t yet approved or cleared by  the [employer] and  has been authorized for detection and/or diagnosis of SARS-CoV-2 by FDA under an Emergency Use Authorization (EUA). This EUA will remain  in effect (meaning this test can be used) for the duration of the COVID-19 declaration under Section 564(b)(1) of the Act, 21 U.S.C.section 360bbb-3(b)(1), unless the authorization is terminated  or revoked sooner.       Influenza A by PCR NEGATIVE NEGATIVE Final   Influenza B by PCR NEGATIVE NEGATIVE Final    Comment: (NOTE) The Xpert Xpress SARS-CoV-2/FLU/RSV plus assay is intended as an aid in the diagnosis of influenza from Nasopharyngeal swab specimens and should not be used as a sole basis for treatment. Nasal washings and aspirates are unacceptable for Xpert Xpress SARS-CoV-2/FLU/RSV testing.  Fact Sheet for Patients: EntrepreneurPulse.com.au  Fact Sheet for Healthcare  Providers: IncredibleEmployment.be  This test is not yet approved or cleared by the [place] FDA and has been authorized for detection and/or diagnosis of SARS-CoV-2 by FDA under an Emergency Use Authorization (EUA). This EUA will remain in effect (meaning this test can be used) for the duration of the COVID-19 declaration under Section 564(b)(1) of the Act, 21 U.S.C. section 360bbb-3(b)(1), unless the authorization is terminated or revoked.  Performed at [facility], [place] [address], [place], [place] [address]        Radiology Studies: EEG adult  Result Date: 05-23-21 Derek Jack, MD     2021/05/23  8:36 PM Routine EEG Report Arthur [name] is a 50 y.o. male with a history of strokes and seizures who is undergoing an EEG to evaluate for seizures. Report: This EEG was acquired with electrodes placed according to the International 10-20 electrode system (including Fp1, Fp2, F3, F4, C3, C4, P3, P4, O1, O2, T3, T4, T5, T6, A1, A2, Fz, Cz, Pz). The following electrodes were missing or displaced: none. The occipital dominant rhythm was 7-8 Hz. This activity is reactive to stimulation. Drowsiness was manifested by background fragmentation; deeper stages of sleep were identified by K complexes and sleep spindles. There was superimposed bifrontal focal slowing. There were no interictal epileptiform discharges. There were no electrographic seizures identified. Photic stimulation and hyperventilation were not performed. Impression and clinical correlation: This EEG was obtained while awake and asleep and is abnormal due to mild diffuse slowing indicative of global cerebral dysfunction as well as focal bifrontal slowing indicating superimposed focal cerebral dysfunction in these areas. Epileptiform abnormalities were not seen during this recording. Patient will be monitored on cEEG overnight. Su Monks, MD [facility] [phone] If 7pm-  7am, please page neurology on call as listed in [place].   Overnight EEG with video  Result Date: 05/08/2021 Lora Havens, MD     05/09/2021 10:01 AM Patient Name: Arthur [name] MRN: [id number] Epilepsy Attending: Lora Havens Referring Physician/Provider: Dr Roland Rack Duration: 05-23-21 1532 to 05/08/2021 1532 Patient history:  50 y.o. male with a history of strokes and seizures who is undergoing an EEG to evaluate for seizures Level of alertness: Awake, asleep AEDs during EEG study: VPA Technical aspects: This EEG study was done with scalp electrodes positioned according to the 10-20 International system of electrode placement. Electrical activity was acquired at a sampling rate of _0  and reviewed with a high frequency filter of _1  and a low frequency filter of _2 . EEG data were recorded continuously and digitally stored. Description: The posterior dominant rhythm consists of 8-9 Hz activity of moderate voltage (25-35 uV) seen predominantly in posterior head  regions, symmetric and reactive to eye opening and eye closing. Sleep was characterized by vertex waves, sleep spindles (12 to 14 Hz), maximal frontocentral region. EEG showed intermittent generalized 3 to 6 Hz theta-delta slowing. Hyperventilation and photic stimulation were not performed.   Of note, study was difficult to interpret after around 2230 on 05/07/21 due to significant electrode artifact. ABNORMALITY - Intermittent slow, generalized IMPRESSION: This technically difficult study is suggestive of mild diffuse encephalopathy, nonspecific etiology. No seizures or epileptiform discharges were seen throughout the recording. Of note, study was difficult to interpret after around 2230 on 05/07/21 due to significant electrode artifact. [place]       LOS: 2 days   Wael Maestas [employer] Pager on [email or web address]  05/09/2021, 10:35 AM

## 2021-05-09 NOTE — Progress Notes (Signed)
Neurology Progress Note  S:No sz overnight. Needed restraints due to agitation and pulling his leads off.  Overnight EEG with no seizures.  Mild diffuse encephalopathy nonspecific in etiology  O: Current vital signs: BP (!) 148/102 (BP Location: Right Arm)   Pulse 84   Temp 97.9 F (36.6 C) (Oral)   Resp 18   SpO2 99%  Vital signs in last 24 hours: Temp:  [97.9 F (36.6 C)-98.6 F (37 C)] 97.9 F (36.6 C) (12/12 0428) Pulse Rate:  [73-84] 84 (12/12 0428) Resp:  [15-18] 18 (12/12 0428) BP: (119-170)/(99-121) 148/102 (12/12 0428) SpO2:  [97 %-100 %] 99 % (12/12 0428)  GENERAL: Fairly well, disheveled appearing male. Awake, alert in NAD. HEENT: Normocephalic and atraumatic. Most of EEG leads off. Abrasion at OS.  LUNGS: Normal respiratory effort.  CV: RRR on tele.  Ext: warm. Abrasions on knuckles  NEURO:  Mental Status: Sleeping, wakes to voice, follows commands. Reports the month as January, year 2022. Able to tell m e he is in the hospital. [place] . Has  a daughter Aldona Bar, who does not know he is in hospital. Does not have a number, Speech/Language: speech is without aphasia or dysarthria.  Naming, repetition, fluency, and comprehension intact. Slow to respond to questions.  Says he had seizure for many years. Had a stroke in the past - was seen at a hospital in [place].  Cranial Nerves:  PERRL. His left eyelid is closed more than right, but with coaching he opens his left eye symmetrically. Sensation is intact to light touch and symmetrical to face. Smile is symmetrical. Hearing intact to voice. Phonation is normal. Tongue is midline without fasciculations. Motor:  5/5 throughout.  Tone: is normal and bulk is normal. Sensation- Intact to light touch bilaterally.  Coordination: FTN intact bilaterally. Moves LEs in coordinated fashion.   DTRs:  2+ throughout.  Gait- deferred-seizure precautions.  Medications  Current Facility-Administered Medications:     0.9 %  sodium chloride infusion, 75 mL/hr, Intravenous, Continuous, Alcario Drought, Jared M, DO, Last Rate: 75 mL/hr at 05/09/21 0631, Infusion Verify at 05/09/21 0631   acetaminophen (TYLENOL) tablet 650 mg, 650 mg, Oral, Q4H PRN **OR** acetaminophen (TYLENOL) suppository 650 mg, 650 mg, Rectal, Q4H PRN, Alcario Drought, Jared M, DO   amLODipine (NORVASC) tablet 10 mg, 10 mg, Oral, Daily, Bonnielee Haff, MD, 10 mg at 05/09/21 8676   aspirin EC tablet 81 mg, 81 mg, Oral, Daily, Bonnielee Haff, MD, 81 mg at 05/09/21 0905   atorvastatin (LIPITOR) tablet 10 mg, 10 mg, Oral, Daily, Bonnielee Haff, MD, 10 mg at 05/09/21 0905   chlorhexidine gluconate (MEDLINE KIT) (PERIDEX) 0.12 % solution 15 mL, 15 mL, Mouth Rinse, BID, Alcario Drought, Jared M, DO, 15 mL at 05/09/21 0909   dextrose (GLUTOSE) 40 % oral gel 31 g, 1 Tube, Oral, PRN, Hayden Rasmussen, MD, 31 g at 05/06/21 1457   divalproex (DEPAKOTE) DR tablet 500 mg, 500 mg, Oral, BID, Alcario Drought, Jared M, DO, 500 mg at 05/09/21 0905   enoxaparin (LOVENOX) injection 40 mg, 40 mg, Subcutaneous, Q24H, Alcario Drought, Jared M, DO, 40 mg at 05/08/21 2155   feeding supplement (ENSURE ENLIVE / ENSURE PLUS) liquid 237 mL, 237 mL, Oral, BID BM, Alcario Drought, Jared M, DO, 237 mL at 05/09/21 0909   labetalol (NORMODYNE) injection 10-20 mg, 10-20 mg, Intravenous, Q2H PRN, Etta Quill, DO, 20 mg at 05/09/21 0032   MEDLINE mouth rinse, 15 mL, Mouth Rinse, 10 times per day, Etta Quill,  DO, 15 mL at 05/09/21 0909   ondansetron (ZOFRAN) tablet 4 mg, 4 mg, Oral, Q6H PRN **OR** ondansetron (ZOFRAN) injection 4 mg, 4 mg, Intravenous, Q6H PRN, Etta Quill, DO  Pertinent Labs K 2.8. Mg 1.6. TSH < 0.010. ETOH < 10. UDS negative. LDL 71.   Imaging Personally reviewed:  CT Head 1. No acute intracranial pathology. 2. Periventricular white matter low attenuation as can be seen with microvascular disease which is greater than expected for the patient's age. Bilateral basal ganglia lacunar  infarcts. Small lacunar infarct in the left thalamus.  MRI Brain  Evaluation is somewhat limited by motion artifact. Within this limitation, no acute intracranial process.  Overnight EEG: Mild diffuse encephalopathy nonspecific in etiology.  No seizures or epileptiform discharges.    Assessment:  50 year old male who presents with recurrent episodes of decreased responsiveness of unclear etiology. We are searching for EEG correlation to a spell. His rEEG shows diffuse cerebral dysfunction but no epileptiform discharges.  Remains somewhat encephalopathic although remains oriented x2 and also oriented x3 at some times but still does not feel like a normal 50 year old during conversations with him.  Reports having had seizures for many years with inability to take medications. Was unable to tell me daughter's number. Suspect prolonged postictal state or contribution from it leading to current mentation.  Impression: -episodes of LOC of unknown etiology.  -History of multiple ED visits with seizure like activity.   Recommendations/Plan:  -Continue Depakote 500 twice daily -I will monitor him on EEG for 1 more day with the hope of capturing a spell for a more definitive diagnosis -Continue trying to trace family for more information and external records if possible Plan relayed to Dr. [place] Pink over secure chat.  -- Amie Portland, MD Neurologist [facility] Pager: [phone]

## 2021-05-09 NOTE — Plan of Care (Signed)
  Problem: Coping: Goal: Ability to adjust to condition or change in health will improve Outcome: Progressing Goal: Ability to identify appropriate support needs will improve Outcome: Progressing   Problem: Clinical Measurements: Goal: Complications related to the disease process, condition or treatment will be avoided or minimized Outcome: Progressing Goal: Diagnostic test results will improve Outcome: Progressing   Problem: Safety: Goal: Verbalization of understanding the information provided will improve Outcome: Progressing

## 2021-05-09 NOTE — Progress Notes (Signed)
Maint complete. Multiple leads moved PRN

## 2021-05-09 NOTE — Procedures (Addendum)
Patient Name: Arthur [name]  MRN: [id number]  Epilepsy Attending: Charlsie Quest  Referring Physician/Provider: Dr Ritta Slot Duration: 05/08/2021 1532 to 05/09/2021 1532   Patient history:  50 y.o. male with a history of strokes and seizures who is undergoing an EEG to evaluate for seizures   Level of alertness: Awake, asleep   AEDs during EEG study: VPA   Technical aspects: This EEG study was done with scalp electrodes positioned according to the 10-20 International system of electrode placement. Electrical activity was acquired at a sampling rate of 500Hz  and reviewed with a high frequency filter of 70Hz  and a low frequency filter of 1Hz . EEG data were recorded continuously and digitally stored.    Description: The posterior dominant rhythm consists of 8-9 Hz activity of moderate voltage (25-35 uV) seen predominantly in posterior head regions, symmetric and reactive to eye opening and eye closing. Sleep was characterized by vertex waves, sleep spindles (12 to 14 Hz), maximal frontocentral region. EEG showed intermittent generalized 3 to 6 Hz theta-delta slowing. Hyperventilation and photic stimulation were not performed.      Of note, study was difficult to interpret between 1532 to 1658 on 05/08/21 and  between 1300 to 1532 on 05/09/2021 due to significant electrode artifact.    ABNORMALITY - Intermittent slow, generalized   IMPRESSION: This study is suggestive of mild diffuse encephalopathy, nonspecific etiology. No seizures or epileptiform discharges were seen throughout the recording.   Shakirra Buehler 

## 2021-05-09 NOTE — Progress Notes (Signed)
Initial Nutrition Assessment  DOCUMENTATION CODES:   Non-severe (moderate) malnutrition in context of social or environmental circumstances  INTERVENTION:  -Recommend addition of thiamine supplementation given pt is malnourished -Ensure Enlive po TID, each supplement provides 350 kcal and 20 grams of protein -MVI with minerals daily  NUTRITION DIAGNOSIS:   Severe Malnutrition related to social / environmental circumstances as evidenced by severe muscle depletion, severe fat depletion  GOAL:   Patient will meet greater than or equal to 90% of their needs  MONITOR:   PO intake, Supplement acceptance, Labs, Weight trends, I & O's, Skin  REASON FOR ASSESSMENT:   Malnutrition Screening Tool    ASSESSMENT:   Pt with PMH significant for stroke and possible seizure disorder admitted with acute metabolic encephalopathy concerning for new seizure.  MRI brain negative for acute findings, though chronic infarcts were noted. Neurology is following. Pt on continuous EEG.  Pt has had excellent PO intake since admit with 50-100% meal completion x 3 meals (70% avg intake). Pt poor historian at time of RD visit -- takes a long time to respond and often doesn't respond appropriately. Pt able to answer some yes/no questions. Pt denies N/V/abdominal pain and endorses good appetite. Pt unsure if he has experienced any changes in weight and there is no weight history available for review. Note pt is homeless and therefore food insecure.  Pt with orders for Ensure Enlive and is consuming them well per RN. Pt agreeable with continued supplementation.   UOP: 1747m x24 hours I/O: +[id number] admit  Medications: Scheduled Meds:  amLODipine  10 mg Oral Daily   aspirin EC  81 mg Oral Daily   atorvastatin  10 mg Oral Daily   chlorhexidine gluconate (MEDLINE KIT)  15 mL Mouth Rinse BID   divalproex  500 mg Oral BID   enoxaparin (LOVENOX) injection  40 mg Subcutaneous Q24H   feeding supplement  237  mL Oral TID BM   mouth rinse  15 mL Mouth Rinse 10 times per day   propranolol  20 mg Oral BID   Continuous Infusions:  sodium chloride 75 mL/hr (05/09/21 1741)    Labs: Recent Labs  Lab 05/06/21 1100 05/08/21 0222 05/09/21 0254  NA 140 137 137  K 3.8 2.8* 3.4*  CL 106 104 107  CO2 15* 24 20*  BUN 23* 6 11  CREATININE 1.06 0.91 0.97  CALCIUM 9.0 8.4* 8.9  MG  --  1.6* 1.9  GLUCOSE 73 97 101*   NUTRITION - FOCUSED PHYSICAL EXAM:  Flowsheet Row Most Recent Value  Orbital Region Severe depletion  Upper Arm Region Moderate depletion  Thoracic and Lumbar Region Moderate depletion  Buccal Region Severe depletion  Temple Region Severe depletion  Clavicle Bone Region Moderate depletion  Clavicle and Acromion Bone Region Moderate depletion  Scapular Bone Region Moderate depletion  Dorsal Hand Moderate depletion  Patellar Region Severe depletion  Anterior Thigh Region Severe depletion  Posterior Calf Region Severe depletion  Edema (RD Assessment) None  Hair Reviewed  Eyes Reviewed  Mouth Unable to assess  [would not open mouth]  Skin Reviewed  Nails Reviewed       Diet Order:   Diet Order             Diet Heart Room service appropriate? Yes; Fluid consistency: Thin  Diet effective now                   EDUCATION NEEDS:   No education needs have been  identified at this time  Skin:  Skin Assessment: Reviewed RN Assessment  Last BM:  12/11  Height:   Ht Readings from Last 1 Encounters:  05/04/21 _0  (1.93 m)    Weight:   Wt Readings from Last 1 Encounters:  05/04/21 90.7 kg    BMI:  There is no height or weight on file to calculate BMI.  Estimated Nutritional Needs:   Kcal:  2500-2700  Protein:  125-135 grams  Fluid:  >2L     Theone Stanley., MS, RD, LDN (she/her/hers) RD pager number and weekend/on-call pager number located in [place].

## 2021-05-10 LAB — BASIC METABOLIC PANEL WITH GFR
Anion gap: 8 (ref 5–15)
BUN: 10 mg/dL (ref 6–20)
CO2: 20 mmol/L — ABNORMAL LOW (ref 22–32)
Calcium: 7.8 mg/dL — ABNORMAL LOW (ref 8.9–10.3)
Chloride: 110 mmol/L (ref 98–111)
Creatinine, Ser: 0.84 mg/dL (ref 0.61–1.24)
GFR, Estimated: >60 mL/min
Glucose, Bld: 80 mg/dL (ref 70–99)
Potassium: 3.3 mmol/L — ABNORMAL LOW (ref 3.5–5.1)
Sodium: 138 mmol/L (ref 135–145)

## 2021-05-10 LAB — GLUCOSE, CAPILLARY
Glucose-Capillary: 86 mg/dL (ref 70–99)
Glucose-Capillary: 98 mg/dL (ref 70–99)
Glucose-Capillary: 87 mg/dL (ref 70–99)

## 2021-05-10 LAB — T3, FREE: T3, Free: 4.6 pg/mL — ABNORMAL HIGH (ref 2.0–4.4)

## 2021-05-10 LAB — THYROID STIMULATING IMMUNOGLOBULIN: Thyroid Stimulating Immunoglob: 1.14 [IU]/L — ABNORMAL HIGH (ref 0.00–0.55)

## 2021-05-10 MED ORDER — PROPRANOLOL HCL 40 MG PO TABS
40.0000 mg | ORAL_TABLET | Freq: Two times a day (BID) | ORAL | Status: DC
  Filled 2021-05-10: qty 1

## 2021-05-10 MED ORDER — CHLORHEXIDINE GLUCONATE 0.12 % MT SOLN
OROMUCOSAL | Status: AC
  Filled 2021-05-10: qty 15

## 2021-05-10 MED ORDER — HYDRALAZINE HCL 50 MG PO TABS
50.0000 mg | ORAL_TABLET | Freq: Three times a day (TID) | ORAL | Status: DC
  Administered 2021-05-10 – 2021-05-14 (×12): 50 mg via ORAL
  Filled 2021-05-10 (×13): qty 1

## 2021-05-10 MED ORDER — PROPRANOLOL HCL 20 MG PO TABS
20.0000 mg | ORAL_TABLET | Freq: Two times a day (BID) | ORAL | Status: DC
  Administered 2021-05-10 – 2021-05-14 (×9): 20 mg via ORAL
  Filled 2021-05-10 (×10): qty 1

## 2021-05-10 MED ORDER — POTASSIUM CHLORIDE 20 MEQ PO PACK
40.0000 meq | PACK | ORAL | Status: AC
  Administered 2021-05-10 (×2): 40 meq via ORAL
  Filled 2021-05-10 (×2): qty 2

## 2021-05-10 NOTE — TOC Progression Note (Signed)
Transition of Care [facility]) - Progression Note    Patient Details  Name: Arthur [name] MRN: [id number] Date of Birth: 1970/07/22  [facility]) CM/SW Contact  Kermit Balo, RN Phone Number: 05/10/2021, 4:51 PM  Clinical Narrative:    MD asked for assistance finding family or friend of pt. CM was able to track down his daughter Arthur [name]. CM has provided her information to MD and contacts updated. She states the patient was supposed to be coming on the bus to see her and didn't make it. She is very concerned. She has limited transportation to [place] but is willing to come here when he is discharged.  TOC following.   Expected Discharge Plan: Homeless Shelter Barriers to Discharge: Continued Medical Work up  Expected Discharge Plan and Services Expected Discharge Plan: Homeless Shelter   Discharge Planning Services: CM Consult   Living arrangements for the past 2 months: Homeless                                       Social Determinants of Health (SDOH) Interventions    Readmission Risk Interventions No flowsheet data found.

## 2021-05-10 NOTE — Procedures (Signed)
Patient Name: Arthur [name]  MRN: [id number]  Epilepsy Attending: Charlsie Quest  Referring Physician/Provider: Dr Ritta Slot Duration: 05/09/2021 1532 to 05/10/2021 0836   Patient history:  50 y.o. male with a history of strokes and seizures who is undergoing an EEG to evaluate for seizures   Level of alertness: Awake, asleep   AEDs during EEG study: VPA   Technical aspects: This EEG study was done with scalp electrodes positioned according to the 10-20 International system of electrode placement. Electrical activity was acquired at a sampling rate of 500Hz  and reviewed with a high frequency filter of 70Hz  and a low frequency filter of 1Hz . EEG data were recorded continuously and digitally stored.    Description: The posterior dominant rhythm consists of 8-9 Hz activity of moderate voltage (25-35 uV) seen predominantly in posterior head regions, symmetric and reactive to eye opening and eye closing. Sleep was characterized by vertex waves, sleep spindles (12 to 14 Hz), maximal frontocentral region. EEG showed intermittent generalized 3 to 6 Hz theta-delta slowing. Hyperventilation and photic stimulation were not performed.     Parts of study were difficult interpret due to significant electrode artifact.   ABNORMALITY - Intermittent slow, generalized   IMPRESSION: This study is suggestive of mild diffuse encephalopathy, nonspecific etiology. No seizures or epileptiform discharges were seen throughout the recording.   [name] 

## 2021-05-10 NOTE — Progress Notes (Signed)
[facility] PROGRESS NOTE   Arthur [name] ZMO:[id number] DOB: [birth year] DOA: 05/06/2021  PCP: Pcp, No  Brief History/Interval Summary: 50 year old Caucasian male with a past medical history of stroke and possibly seizure disorder who has had 3 visits to the emergency department in 3 days with seizure activity.  Subsequently hospitalized for further management.  Reason for Visit: Seizures  Consultants: Neurology  Procedures: EEG    Subjective/Interval History: Patient noted to be somnolent this morning.      Assessment/Plan:  Seizure disorder with acute metabolic encephalopathy MRI brain was negative for acute findings.  Chronic infarcts were noted.   Neurology is following. Patient on continuous EEG.  Remains on Depakote.  Metabolic acidosis was from seizure activity.  Bicarbonate noted to have improved. For further neurological input.  History of stroke Chronic infarcts noted on MRI brain.  Started on aspirin and statin.  LDL 71.    Essential hypertension No known history of hypertension.  Not noted to be on any antihypertensives prior to admission.  Patient was placed on amlodipine.  Dose was increased.  Blood pressure remains poorly controlled.  We will placed on hydralazine today.  He was also started on propranolol for hyperthyroidism as discussed below.  Will not increase the dose of propranolol as heart rate noted to be in the 50s.  Hypokalemia and hypomagnesemia Continue to replace potassium.  Magnesium has improved.    Hyperthyroidism Patient without any overt signs or symptoms of hyperthyroidism.  Does not have any thyroid enlargement on examination.  TSH noted to be suppressed to less than 0.01.  Free T4 noted to be elevated at 2.74.  Free T3 level also noted to be elevated at 4.6.thyroid-stimulating immunoglobulin level is pending.   Ideally this patient should be followed by endocrinology/PCP. Would be hesitant to initiate antithyroid medications in  this patient who is homeless and has a history of noncompliance.  Especially since he is not exhibiting any overt signs of hyperthyroidism.  If the patient follows up with PCP then they could refer him to endocrinology. Will initiate low-dose beta-blocker.    Normocytic anemia Hemoglobin is stable.  Anemia panel without any clear-cut deficiencies   Homelessness Patient is homeless. Urine drug screen unremarkable.  Alcohol level less than 10.  Renal care is following.  Minimally displaced fracture of the left nasal bone This is secondary to fall he sustained after seizure several days ago.  He also has soft tissue contusion of the left cheek and chin.  Outpatient follow-up with ENT/Plastics as needed.  No need for any intervention based on my discussions with Dr. Marcelline Deist with ENT   DVT Prophylaxis: Lovenox Code Status: Full code Family Communication: No family at bedside Disposition Plan: Patient is homeless.   Status is: Inpatient  Remains inpatient appropriate because: Seizure activity       Medications: Scheduled:  amLODipine  10 mg Oral Daily   aspirin EC  81 mg Oral Daily   atorvastatin  10 mg Oral Daily   chlorhexidine gluconate (MEDLINE KIT)  15 mL Mouth Rinse BID   divalproex  500 mg Oral BID   enoxaparin (LOVENOX) injection  40 mg Subcutaneous Q24H   feeding supplement  237 mL Oral TID BM   mouth rinse  15 mL Mouth Rinse 10 times per day   multivitamin with minerals  1 tablet Oral Daily   potassium chloride  40 mEq Oral Q4H   propranolol  40 mg Oral BID   Continuous:  sodium chloride Stopped (05/10/21 0538)  PRN:acetaminophen **OR** acetaminophen, dextrose, labetalol, ondansetron **OR** ondansetron (ZOFRAN) IV  Antibiotics: Anti-infectives (From admission, onward)    None       Objective:  Vital Signs  Vitals:   05/09/21 2141 05/09/21 2331 05/09/21 2332 05/10/21 0448  BP: (!) 180/112  (!) 164/94 (!) 180/108  Pulse: 67   63  Resp:   19 16  Temp:   98.4 F (36.9 C)  98 F (36.7 C)  TempSrc:      SpO2:  100%  99%    Intake/Output Summary (Last 24 hours) at 05/10/2021 0907 Last data filed at 05/09/2021 2123 Gross per 24 hour  Intake 332.5 ml  Output 800 ml  Net -467.5 ml    There were no vitals filed for this visit.   General appearance: Somnolent this morning Resp: Clear to auscultation bilaterally.  Normal effort Cardio: S1-S2 is normal regular.  No S3-S4.  No rubs murmurs or bruit GI: Abdomen is soft.  Nontender nondistended.  Bowel sounds are present normal.  No masses organomegaly Extremities: No edema.   Neurologic:  No focal neurological deficits.     Lab Results:  Data Reviewed: I have personally reviewed following labs and imaging studies  CBC: Recent Labs  Lab 05/03/21 0922 05/04/21 1448 05/06/21 1100 05/08/21 0222  WBC 11.7* 8.1 8.1 6.9  NEUTROABS 9.8* 5.2 6.8  --   HGB 15.0 13.6 13.7 12.0*  HCT 43.5 39.2 40.1 34.7*  MCV 86.3 85.8 87.6 84.6  PLT 240 212 225 212     Basic Metabolic Panel: Recent Labs  Lab 05/04/21 1448 05/06/21 1100 05/08/21 0222 05/09/21 0254 05/10/21 0133  NA 136 140 137 137 138  K 3.5 3.8 2.8* 3.4* 3.3*  CL 105 106 104 107 110  CO2 18* 15* 24 20* 20*  GLUCOSE 83 73 97 101* 80  BUN 25* 23* 6 11 10  CREATININE 1.25* 1.06 0.91 0.97 0.84  CALCIUM 9.2 9.0 8.4* 8.9 7.8*  MG  --   --  1.6* 1.9  --      GFR: Estimated Creatinine Clearance: 130.6 mL/min (by C-G formula based on SCr of 0.84 mg/dL).  Liver Function Tests: Recent Labs  Lab 05/03/21 0922 05/04/21 1448 05/08/21 0222  AST 33 34 25  ALT 24 24 26  ALKPHOS 87 72 56  BILITOT 1.8* 1.6* 0.8  PROT 7.8 6.8 5.3*  ALBUMIN 4.4 4.0 2.9*       CBG: Recent Labs  Lab 05/06/21 2157 05/06/21 2350 05/07/21 0310 05/07/21 0405 05/07/21 0734  GLUCAP 85 95 83 93 73       Recent Results (from the past 240 hour(s))  Resp Panel by RT-PCR (Flu A&B, Covid) Nasopharyngeal Swab     Status: None   Collection  Time: 05/06/21  5:06 PM   Specimen: Nasopharyngeal Swab; Nasopharyngeal(NP) swabs in vial transport medium  Result Value Ref Range Status   SARS Coronavirus 2 by RT PCR NEGATIVE NEGATIVE Final    Comment: (NOTE) SARS-CoV-2 target nucleic acids are NOT DETECTED.  The SARS-CoV-2 RNA is generally detectable in upper respiratory specimens during the acute phase of infection. The lowest concentration of SARS-CoV-2 viral copies this assay can detect is 138 copies/mL. A negative result does not preclude SARS-Cov-2 infection and should not be used as the sole basis for treatment or other patient management decisions. A negative result may occur with  improper specimen collection/handling, submission of specimen other than nasopharyngeal swab, presence of viral mutation(s) within the areas targeted by this   assay, and inadequate number of viral copies(<138 copies/mL). A negative result must be combined with clinical observations, patient history, and epidemiological information. The expected result is Negative.  Fact Sheet for Patients:  EntrepreneurPulse.com.au  Fact Sheet for Healthcare Providers:  IncredibleEmployment.be  This test is no t yet approved or cleared by the [place] FDA and  has been authorized for detection and/or diagnosis of SARS-CoV-2 by FDA under an Emergency Use Authorization (EUA). This EUA will remain  in effect (meaning this test can be used) for the duration of the COVID-19 declaration under Section 564(b)(1) of the Act, 21 U.S.C.section 360bbb-3(b)(1), unless the authorization is terminated  or revoked sooner.       Influenza A by PCR NEGATIVE NEGATIVE Final   Influenza B by PCR NEGATIVE NEGATIVE Final    Comment: (NOTE) The Xpert Xpress SARS-CoV-2/FLU/RSV plus assay is intended as an aid in the diagnosis of influenza from Nasopharyngeal swab specimens and should not be used as a sole basis for treatment. Nasal washings  and aspirates are unacceptable for Xpert Xpress SARS-CoV-2/FLU/RSV testing.  Fact Sheet for Patients: EntrepreneurPulse.com.au  Fact Sheet for Healthcare Providers: IncredibleEmployment.be  This test is not yet approved or cleared by the [place] FDA and has been authorized for detection and/or diagnosis of SARS-CoV-2 by FDA under an Emergency Use Authorization (EUA). This EUA will remain in effect (meaning this test can be used) for the duration of the COVID-19 declaration under Section 564(b)(1) of the Act, 21 U.S.C. section 360bbb-3(b)(1), unless the authorization is terminated or revoked.  Performed at [facility], [place] [address] [place], [place] [address]        Radiology Studies: No results found.     LOS: 3 days   Iliany Losier [employer] on [email or web address]  05/10/2021, 9:07 AM

## 2021-05-10 NOTE — Progress Notes (Signed)
Neurology Progress Note  S:No sz overnight. Needed restraints due to agitation and pulling his leads off.  Overnight EEG with no seizures.  Intermittent slow, general lysed suggestive of mild diffuse encephalopathy.  O: Current vital signs: BP (!) 160/100 (BP Location: Left Arm)    Pulse 70    Temp 98 F (36.7 C) (Oral)    Resp 19    SpO2 100%  Vital signs in last 24 hours: Temp:  [97.7 F (36.5 C)-99.4 F (37.4 C)] 98 F (36.7 C) (12/13 1223) Pulse Rate:  [63-70] 70 (12/13 1223) Resp:  [16-23] 19 (12/13 1223) BP: (151-180)/(94-117) 160/100 (12/13 1223) SpO2:  [99 %-100 %] 100 % (12/13 1223)  GENERAL: Fairly well, disheveled appearing male. Awake, alert in NAD. HEENT: Normocephalic and atraumatic. Most of EEG leads off. Abrasion at OS.  LUNGS: Normal respiratory effort.  CV: RRR on tele.  Ext: warm. Abrasions on knuckles  NEURO:  Mental status and speech: Aware alert oriented to self, fact that he is in the hospital. Speech is not slurred but he is slow to respond to questions Again he does not know the correct year.  Was unable to tell me detailed history. Naming is intact.  Repetition is intact.  Comprehension is intact. Cranial nerves II to XII intact exception-with mild left eye ptosis. Motor exam antigravity in all fours Sensation intact light touch Coordination with no dysmetria   Medications  Current Facility-Administered Medications:    0.9 %  sodium chloride infusion, 75 mL/hr, Intravenous, Continuous, Alcario Drought, Jared M, DO, Paused at 05/10/21 1117   acetaminophen (TYLENOL) tablet 650 mg, 650 mg, Oral, Q4H PRN, 650 mg at 05/10/21 1005 **OR** acetaminophen (TYLENOL) suppository 650 mg, 650 mg, Rectal, Q4H PRN, Alcario Drought, Jared M, DO   amLODipine (NORVASC) tablet 10 mg, 10 mg, Oral, Daily, Bonnielee Haff, MD, 10 mg at 05/10/21 0910   aspirin EC tablet 81 mg, 81 mg, Oral, Daily, Bonnielee Haff, MD, 81 mg at 05/10/21 1005   atorvastatin (LIPITOR) tablet 10 mg, 10 mg,  Oral, Daily, Bonnielee Haff, MD, 10 mg at 05/10/21 1005   chlorhexidine gluconate (MEDLINE KIT) (PERIDEX) 0.12 % solution 15 mL, 15 mL, Mouth Rinse, BID, Alcario Drought, Jared M, DO, 15 mL at 05/10/21 0914   dextrose (GLUTOSE) 40 % oral gel 31 g, 1 Tube, Oral, PRN, Hayden Rasmussen, MD, 31 g at 05/06/21 1457   divalproex (DEPAKOTE) DR tablet 500 mg, 500 mg, Oral, BID, Alcario Drought, Jared M, DO, 500 mg at 05/10/21 0910   enoxaparin (LOVENOX) injection 40 mg, 40 mg, Subcutaneous, Q24H, Alcario Drought, Jared M, DO, 40 mg at 05/09/21 2142   feeding supplement (ENSURE ENLIVE / ENSURE PLUS) liquid 237 mL, 237 mL, Oral, TID BM, Bonnielee Haff, MD, 237 mL at 05/10/21 1257   hydrALAZINE (APRESOLINE) tablet 50 mg, 50 mg, Oral, Q8H, Bonnielee Haff, MD, 50 mg at 05/10/21 1153   labetalol (NORMODYNE) injection 10-20 mg, 10-20 mg, Intravenous, Q2H PRN, Alcario Drought, Jared M, DO, 20 mg at 05/10/21 0450   MEDLINE mouth rinse, 15 mL, Mouth Rinse, 10 times per day, Alcario Drought, Jared M, DO, 15 mL at 05/10/21 1200   multivitamin with minerals tablet 1 tablet, 1 tablet, Oral, Daily, Bonnielee Haff, MD, 1 tablet at 05/10/21 0910   ondansetron (ZOFRAN) tablet 4 mg, 4 mg, Oral, Q6H PRN **OR** ondansetron (ZOFRAN) injection 4 mg, 4 mg, Intravenous, Q6H PRN, Alcario Drought, Jared M, DO   propranolol (INDERAL) tablet 20 mg, 20 mg, Oral, BID, Bonnielee Haff, MD, 20 mg at 05/10/21  1005  Labs CBC    Component Value Date/Time   WBC 6.9 05/08/2021 0222   RBC 4.43 05/09/2021 0254   RBC 4.10 (L) 05/08/2021 0222   HGB 12.0 (L) 05/08/2021 0222   HCT 34.7 (L) 05/08/2021 0222   PLT 212 05/08/2021 0222   MCV 84.6 05/08/2021 0222   MCH 29.3 05/08/2021 0222   MCHC 34.6 05/08/2021 0222   RDW 12.6 05/08/2021 0222   LYMPHSABS 0.8 05/06/2021 1100   MONOABS 0.5 05/06/2021 1100   EOSABS 0.0 05/06/2021 1100   BASOSABS 0.0 05/06/2021 1100   BMP Latest Ref Rng & Units 05/10/2021 05/09/2021 05/08/2021  Glucose 70 - 99 mg/dL 80 101(H) 97  BUN 6 - 20 mg/dL _0 Creatinine 0.61 - 1.24 mg/dL 0.84 0.97 0.91  Sodium 135 - 145 mmol/L 138 137 137  Potassium 3.5 - 5.1 mmol/L 3.3(L) 3.4(L) 2.8(L)  Chloride 98 - 111 mmol/L 110 107 104  CO2 22 - 32 mmol/L 20(L) 20(L) 24  Calcium 8.9 - 10.3 mg/dL 7.8(L) 8.9 8.4(L)     Imaging Personally reviewed:  CT Head 1. No acute intracranial pathology. 2. Periventricular white matter low attenuation as can be seen with microvascular disease which is greater than expected for the patient's age. Bilateral basal ganglia lacunar infarcts. Small lacunar infarct in the left thalamus.  MRI Brain  Evaluation is somewhat limited by motion artifact. Within this limitation, no acute intracranial process.  Overnight EEG for the past 2 nights with the caveat of reduced recording duration of recording due to him pulling out his leads: Mild diffuse encephalopathy nonspecific in etiology.  No seizures or epileptiform discharges.  Assessment:  50 year old male who presents with recurrent episodes of decreased responsiveness of unclear etiology. We are searching for EEG correlation to a spell. His rEEG shows diffuse cerebral dysfunction but no epileptiform discharges.  Remains somewhat encephalopathic although remains oriented x2 and also oriented x3 at some times but still does not feel like a normal 50 year old during conversations with him.  Reports having had seizures for many years with inability to take medications. Suspect prolonged postictal state or contribution from it leading to current mentation.  Impression: -episodes of LOC of unknown etiology.  -History of multiple ED visits with seizure like activity.   Recommendations/Plan:  -Continue Depakote 500 twice daily -LTM EEG has been unremarkable over past couple of days-can be discontinued. -Continue trying to trace family for more information and external records if possible -Continue to maintain seizure precautions. I will continue to follow. Plan relayed to  Dr. Maryland Pink over secure chat.  -- Amie Portland, MD Neurologist [facility] Pager: [phone]

## 2021-05-10 NOTE — Progress Notes (Signed)
Discontinued cEEG study.  Notified [facility] monitoring.  No skin breakdown observed. 

## 2021-05-10 NOTE — Care Management Important Message (Signed)
Important Message  Patient Details  Name: Arthur [name] MRN: [id number] Date of Birth: May 01, 1971   Medicare Important Message Given:  Yes     [name] 05/10/2021, 3:11 PM

## 2021-05-11 ENCOUNTER — Other Ambulatory Visit: Payer: Self-pay

## 2021-05-11 MED ORDER — PROPRANOLOL HCL 20 MG PO TABS
20.0000 mg | ORAL_TABLET | Freq: Two times a day (BID) | ORAL | 2 refills | Status: AC
  Filled 2021-05-11 – 2021-06-30 (×2): qty 60, 30d supply, fill #0
  Filled 2021-08-07 – 2021-08-08 (×2): qty 60, 30d supply, fill #1

## 2021-05-11 MED ORDER — ASPIRIN 81 MG PO TBEC
81.0000 mg | DELAYED_RELEASE_TABLET | Freq: Every day | ORAL | 2 refills | Status: AC
Start: 2021-05-12 — End: 2021-08-10
  Filled 2021-05-11 – 2021-08-07 (×2): qty 30, 30d supply, fill #0
  Filled 2021-08-08: qty 90, 90d supply, fill #0

## 2021-05-11 MED ORDER — AMLODIPINE BESYLATE 10 MG PO TABS
10.0000 mg | ORAL_TABLET | Freq: Every day | ORAL | 2 refills | Status: AC
  Filled 2021-05-11 – 2021-06-30 (×2): qty 30, 30d supply, fill #0
  Filled 2021-08-07 – 2021-08-08 (×2): qty 30, 30d supply, fill #1

## 2021-05-11 MED ORDER — ATORVASTATIN CALCIUM 10 MG PO TABS
10.0000 mg | ORAL_TABLET | Freq: Every day | ORAL | 2 refills | Status: AC
  Filled 2021-05-11 – 2021-06-30 (×2): qty 30, 30d supply, fill #0
  Filled 2021-08-07 – 2021-08-08 (×2): qty 30, 30d supply, fill #1

## 2021-05-11 NOTE — Progress Notes (Addendum)
Neurology Progress Note  S: I appreciate case managers do diligence in getting [place] the phone number for the daughter.  Family was able to speak with the daughter.  The daughter herself has limited mobility and is unable to come to the hospital.  She lives in [place]. She said she had last seen her father in person in July but had a video conversation with him on December 1. He lived in [place] and took a bus to come to [place] to see her.  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  He also has mental health issues including schizophrenia.  He is on disability and has been forgetting to file his paperwork.  In spite of his disability and struggle with amphetamine, she said that this current presentation is way out of character for her father. She denied her father having any alcohol abuse issues. I have updated addresses the patient's address and the daughters home number and cell phone number in the chart.  O: Current vital signs: BP 102/85 (BP Location: Right Arm)    Pulse 66    Temp 98.4 F (36.9 C) (Oral)    Resp 17    SpO2 100%  Vital signs in last 24 hours: Temp:  [98 F (36.7 C)-98.7 F (37.1 C)] 98.4 F (36.9 C) (12/14 1140) Pulse Rate:  [59-84] 66 (12/14 1140) Resp:  [17-20] 17 (12/14 1140) BP: (102-167)/(85-105) 102/85 (12/14 1140) SpO2:  [97 %-100 %] 100 % (12/14 1140)  GENERAL: Fairly well, disheveled appearing male. Awake, alert in NAD. HEENT: Normocephalic and atraumatic. Most of EEG leads off. Abrasion at OS.  LUNGS: Normal respiratory effort.  CV: RRR on tele.  Ext: warm. Abrasions on knuckles  NEURO:  Mental status and speech: Much more awake today-aware alert oriented to self, fact that he is in the hospital.  Not aware of the city but is able to tell me that he traveled here to see his daughter Aldona Bar. Speech is not slurred but he is slow to respond to questions Again he does not know the correct year.  Was unable to tell me detailed  history. Naming is intact.  Repetition is intact.  Comprehension is intact. Cranial nerves II to XII intact exception-with mild left eye ptosis. Motor exam antigravity in all fours Sensation intact light touch Coordination with no dysmetria   Medications  Current Facility-Administered Medications:    0.9 %  sodium chloride infusion, 75 mL/hr, Intravenous, Continuous, Alcario Drought, Jared M, DO, Last Rate: 75 mL/hr at 05/11/21 0904, 75 mL/hr at 05/11/21 0904   acetaminophen (TYLENOL) tablet 650 mg, 650 mg, Oral, Q4H PRN, 650 mg at 05/11/21 0820 **OR** acetaminophen (TYLENOL) suppository 650 mg, 650 mg, Rectal, Q4H PRN, Alcario Drought, Jared M, DO   amLODipine (NORVASC) tablet 10 mg, 10 mg, Oral, Daily, Bonnielee Haff, MD, 10 mg at 05/11/21 4665   aspirin EC tablet 81 mg, 81 mg, Oral, Daily, Bonnielee Haff, MD, 81 mg at 05/11/21 0926   atorvastatin (LIPITOR) tablet 10 mg, 10 mg, Oral, Daily, Bonnielee Haff, MD, 10 mg at 05/11/21 0926   chlorhexidine gluconate (MEDLINE KIT) (PERIDEX) 0.12 % solution 15 mL, 15 mL, Mouth Rinse, BID, Alcario Drought, Jared M, DO, 15 mL at 05/11/21 0903   dextrose (GLUTOSE) 40 % oral gel 31 g, 1 Tube, Oral, PRN, Hayden Rasmussen, MD, 31 g at 05/06/21 1457   divalproex (DEPAKOTE) DR tablet 500 mg, 500 mg, Oral, BID, Jennette Kettle M, DO, 500 mg at 05/11/21 [phone]  enoxaparin (LOVENOX) injection 40 mg, 40 mg, Subcutaneous, Q24H, Alcario Drought, Jared M, DO, 40 mg at 05/10/21 2151   feeding supplement (ENSURE ENLIVE / ENSURE PLUS) liquid 237 mL, 237 mL, Oral, TID BM, Bonnielee Haff, MD, 237 mL at 05/11/21 0926   hydrALAZINE (APRESOLINE) tablet 50 mg, 50 mg, Oral, Q8H, Bonnielee Haff, MD, 50 mg at 05/11/21 0820   labetalol (NORMODYNE) injection 10-20 mg, 10-20 mg, Intravenous, Q2H PRN, Etta Quill, DO, 20 mg at 05/10/21 0450   MEDLINE mouth rinse, 15 mL, Mouth Rinse, 10 times per day, Etta Quill, DO, 15 mL at 05/11/21 4801   multivitamin with minerals tablet 1 tablet, 1 tablet,  Oral, Daily, Bonnielee Haff, MD, 1 tablet at 05/11/21 0926   ondansetron (ZOFRAN) tablet 4 mg, 4 mg, Oral, Q6H PRN **OR** ondansetron (ZOFRAN) injection 4 mg, 4 mg, Intravenous, Q6H PRN, Alcario Drought, Jared M, DO   propranolol (INDERAL) tablet 20 mg, 20 mg, Oral, BID, Bonnielee Haff, MD, 20 mg at 05/11/21 0929  Labs CBC    Component Value Date/Time   WBC 6.9 05/08/2021 0222   RBC 4.43 05/09/2021 0254   RBC 4.10 (L) 05/08/2021 0222   HGB 12.0 (L) 05/08/2021 0222   HCT 34.7 (L) 05/08/2021 0222   PLT 212 05/08/2021 0222   MCV 84.6 05/08/2021 0222   MCH 29.3 05/08/2021 0222   MCHC 34.6 05/08/2021 0222   RDW 12.6 05/08/2021 0222   LYMPHSABS 0.8 05/06/2021 1100   MONOABS 0.5 05/06/2021 1100   EOSABS 0.0 05/06/2021 1100   BASOSABS 0.0 05/06/2021 1100   BMP Latest Ref Rng & Units 05/10/2021 05/09/2021 05/08/2021  Glucose 70 - 99 mg/dL 80 101(H) 97  BUN 6 - 20 mg/dL $Remove'10 11 6  'KpuYuHb$ Creatinine 0.61 - 1.24 mg/dL 0.84 0.97 0.91  Sodium 135 - 145 mmol/L 138 137 137  Potassium 3.5 - 5.1 mmol/L 3.3(L) 3.4(L) 2.8(L)  Chloride 98 - 111 mmol/L 110 107 104  CO2 22 - 32 mmol/L 20(L) 20(L) 24  Calcium 8.9 - 10.3 mg/dL 7.8(L) 8.9 8.4(L)     Imaging Personally reviewed:  CT Head 1. No acute intracranial pathology. 2. Periventricular white matter low attenuation as can be seen with microvascular disease which is greater than expected for the patient's age. Bilateral basal ganglia lacunar infarcts. Small lacunar infarct in the left thalamus.  MRI Brain  Evaluation is somewhat limited by motion artifact. Within this limitation, no acute intracranial process.  Overnight EEG for the past 2 nights with the caveat of reduced recording duration of recording due to him pulling out his leads: Mild diffuse encephalopathy nonspecific in etiology.  No seizures or epileptiform discharges.  Assessment:  50 year old male who presents with recurrent episodes of decreased responsiveness of unclear etiology. We are  searching for EEG correlation to a spell. His rEEG shows diffuse cerebral dysfunction but no epileptiform discharges.  Remains somewhat encephalopathic although remains oriented x2 and also oriented x3 at some times but still does not feel like a normal 50 year old during conversations with him.  Reports having had seizures for many years with inability to take medications. Suspect prolonged postictal state or contribution from it leading to current mentation. Multiple days of LTM EEG unremarkable-was discontinued yesterday. 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  She reports that he has severe mental health issues including schizophrenia at baseline-unclear if he is on any medications. Again it does not seem like he is back to the  normal baseline mentation of a 50 year old and his daughter agrees.  Wonder if untreated psychiatric illness is also playing a role in this and would appreciate involvement of psychiatry and their recommendations. Although no significant alcohol history-I will treat him for Wernicke's after checking the thiamine level.  Impression: -episodes of LOC of unknown etiology.  -History of multiple ED visits with seizure like activity.  History of seizure per daughter. -History of amphetamine abuse -History of schizophrenia and mental health disorders per family. -?  Wernicke's  Recommendations/Plan:  -Continue Depakote 500 twice daily -Check level in the AM -Maintain seizure precautions -Psychiatry consultation -Daughter has requested that updates be provided to her by the primary team.  I would be available if needed to provide more updates to the daughter.  She also wanted to find out about logistics of discharge and how the patient will be transported to her house since she has mobility issues and cannot make it to the hospital.  I will let the case managers decide that as he gets ready for discharge. -I  will also recommend getting outside records for review if possible. -Check B1 level -Replete thiamine-Wernicke's protocol.  Plan discussed with Dr.Dahal on phone.  -- Amie Portland, MD Neurologist [facility] Pager: [phone]

## 2021-05-11 NOTE — Evaluation (Signed)
Physical Therapy Evaluation Patient Details Name: Arthur [name] MRN: [id number] DOB: 09-23-1970 Today's Date: 05/11/2021  History of Present Illness  Patient is a 50 y/o male admitted after three visits to ED in 3 days due to seizures.  PMH positive for CVA and seizure disorder, but not on meds.  Patient is homeless and found to have acute metabolic encephalopathy, high blood pressure, hypokalemia, hypomagnesemia and min displaced L nasal bone fracture.  Clinical Impression  Patient presents with decreased mobility due to generalized weakness, decreased balance/vertical orientation and decreased activity tolerance.  Multiple scrapes on his skin from seziures with falls.  Currently min A overall for ambulation with RW, transfers and S for bed mobility.  Previously independent with cane.  Feel he can benefit from continued skilled PT in the acute setting prior to hopeful home with daughter.  Recommend follow up outpatient PT at d/c.        Recommendations for follow up therapy are one component of a multi-disciplinary discharge planning process, led by the attending physician.  Recommendations may be updated based on patient status, additional functional criteria and insurance authorization.  Follow Up Recommendations Outpatient PT    Assistance Recommended at Discharge Intermittent Supervision/Assistance  Functional Status Assessment Patient has had a recent decline in their functional status and demonstrates the ability to make significant improvements in function in a reasonable and predictable amount of time.  Equipment Recommendations  Rolling walker (2 wheels)    Recommendations for Other Services       Precautions / Restrictions Precautions Precautions: Fall Precaution Comments: seizure      Mobility  Bed Mobility Overal bed mobility: Needs Assistance Bed Mobility: Supine to Sit     Supine to sit: HOB elevated;Supervision     General bed mobility comments: assist for  lines/safety    Transfers Overall transfer level: Needs assistance Equipment used: Rolling walker (2 wheels) Transfers: Sit to/from Stand Sit to Stand: Min guard           General transfer comment: assist for balance    Ambulation/Gait Ambulation/Gait assistance: Min guard;Min assist Gait Distance (Feet): 150 Feet Assistive device: Rolling walker (2 wheels) Gait Pattern/deviations: Step-through pattern;Drifts right/left;Decreased stride length;Shuffle       General Gait Details: initially minguard and progressing to min A due to veering to L with multiple cues for forward gaze for upright orientation and for correcting L veering  Stairs            Wheelchair Mobility    Modified Rankin (Stroke Patients Only)       Balance Overall balance assessment: Needs assistance Sitting-balance support: Feet supported Sitting balance-Leahy Scale: Good Sitting balance - Comments: reaching to fix socks sitting EOB   Standing balance support: No upper extremity supported;During functional activity Standing balance-Leahy Scale: Fair Standing balance comment: brushed teeth at sink with S and some A due to FM weakness                             Pertinent Vitals/Pain Pain Assessment: Faces Faces Pain Scale: Hurts little more Pain Location: generalized Pain Descriptors / Indicators: Sore Pain Intervention(s): Monitored during session;Repositioned    Home Living Family/patient expects to be discharged to:: Shelter/Homeless                        Prior Function Prior Level of Function : Independent/Modified Independent  Mobility Comments: used cane PTA, multiple falls with seizures       Hand Dominance        Extremity/Trunk Assessment   Upper Extremity Assessment Upper Extremity Assessment: Generalized weakness    Lower Extremity Assessment Lower Extremity Assessment: Generalized weakness       Communication    Communication: Expressive difficulties (some dysarthria noted)  Cognition Arousal/Alertness: Awake/alert Behavior During Therapy: WFL for tasks assessed/performed Overall Cognitive Status: Impaired/Different from baseline Area of Impairment: Safety/judgement;Problem solving;Orientation                 Orientation Level: Disoriented to;Time;Place       Safety/Judgement: Decreased awareness of safety;Decreased awareness of deficits   Problem Solving: Slow processing;Requires verbal cues General Comments: knew in [name], not name of hospital, knew December, not day        General Comments General comments (skin integrity, edema, etc.): Wanted to call daughter Arthur [name]); RNCM reports she put number in chart, but still unable to reach her    Exercises     Assessment/Plan    PT Assessment Patient needs continued PT services  PT Problem List Decreased strength;Decreased mobility;Decreased safety awareness;Decreased knowledge of precautions;Decreased cognition;Decreased activity tolerance;Decreased balance;Decreased knowledge of use of DME       PT Treatment Interventions DME instruction;Therapeutic activities;Cognitive remediation;Patient/family education;Therapeutic exercise;Gait training;Stair training;Balance training;Functional mobility training    PT Goals (Current goals can be found in the Care Plan section)  Acute Rehab PT Goals Patient Stated Goal: to get in touch with daughter, Arthur [name] PT Goal Formulation: With patient Time For Goal Achievement: 05/25/21 Potential to Achieve Goals: Good    Frequency Min 3X/week   Barriers to discharge Decreased caregiver support;Other (comment) homelessness; may go stay with daughter    Co-evaluation               AM-PAC PT "6 Clicks" Mobility  Outcome Measure Help needed turning from your back to your side while in a flat bed without using bedrails?: None Help needed moving from lying on your back to sitting on the  side of a flat bed without using bedrails?: None Help needed moving to and from a bed to a chair (including a wheelchair)?: A Little Help needed standing up from a chair using your arms (e.g., wheelchair or bedside chair)?: A Little Help needed to walk in hospital room?: A Little Help needed climbing 3-5 steps with a railing? : A Lot 6 Click Score: 19    End of Session Equipment Utilized During Treatment: Gait belt Activity Tolerance: Patient tolerated treatment well Patient left: in chair;with chair alarm set;with call bell/phone within reach   PT Visit Diagnosis: Other abnormalities of gait and mobility (R26.89);Muscle weakness (generalized) (M62.81);Other symptoms and signs involving the nervous system (R29.898)    Time: 3748-2707 PT Time Calculation (min) (ACUTE ONLY): 36 min   Charges:   PT Evaluation $PT Eval Moderate Complexity: 1 Mod PT Treatments $Gait Training: 8-22 mins        Sheran Lawless, PT Acute Rehabilitation Services Pager:[phone] Office:[phone] 05/11/2021   Elray Mcgregor 05/11/2021, 11:31 AM

## 2021-05-11 NOTE — Discharge Summary (Signed)
Physician Discharge Summary  Arthur [name] PFX:[id number] DOB: 1970/08/05 DOA: 05/06/2021  PCP: Pcp, No  Admit date: 05/06/2021 Discharge date: 05/11/2021  Admitted From: Homeless Discharge disposition: Discharge home with daughter   Code Status: Full Code  Diet Recommendation: Cardiac diet  Discharge Diagnosis:   Principal Problem:   Seizures (HCC) Active Problems:   HTN (hypertension)   Seizure (HCC)   Malnutrition of moderate degree   History of Present Illness / Brief narrative:  Patient is a 50 year old with homelessness Caucasian male with a past medical history of stroke and possibly seizure disorder who has had 3 visits to the emergency department in 3 days with seizure activity. Subsequently hospitalized for further management.    Subjective:  Seen and examined this morning.  Middle-aged Caucasian male.  Looks older for his age.  Sitting up in chair.  Not in distress.  Walked with therapy.  Hospital Course:  Acute metabolic encephalopathy Seizure disorder -Presented with 3 days of seizure activity  -MRI brain was negative for acute findings but showed chronic infarcts.   -Neurology consultation was obtained.  Patient was maintained on continuous EEG.   -Patient was previously on Depakote but was noncompliant due to.  Same was resumed.   History of stroke -Chronic infarcts noted in MRI.  Started on aspirin and statin.   Essential hypertension -Blood pressure currently controlled on propanolol, amlodipine and hydralazine.    Hyperthyroidism -Patient without any overt signs or symptoms of hyperthyroidism.  Does not have any thyroid enlargement on examination.   -TSH noted to be suppressed to less than 0.01.  Free T4 noted to be elevated at 2.74.  Free T3 level also noted to be elevated at 4.6.thyroid-stimulating immunoglobulin level is pending.   -Because of homelessness and medical noncompliance, patient was not started on antithyroid medications.  He has  however been initiated on propanolol.  Patient does not seem to have any symptoms per se related to hyperthyroidism. Recent Labs    05/08/21 0222  TSH <0.010*   Minimally displaced fracture of the left nasal bone -This is secondary to fall he sustained after seizure several days ago.  He also has soft tissue contusion of the left cheek and chin.  Outpatient follow-up with ENT/Plastics as needed.   -No need for any intervention based on the previous hospitalist's discussion with Dr. Elijah Birk with ENT  [facility]  Acute metabolic acidosis -Creatinine was elevated to 1.31, improved with hydration.  Serum bicarb level improved as well. Recent Labs    05/03/21 0922 05/04/21 1448 05/06/21 1100 05/08/21 0222 05/09/21 0254 05/10/21 0133  BUN 24* 25* 23* 6 11 10   CREATININE 1.31* 1.25* 1.06 0.91 0.97 0.84  CO2 17* 18* 15* 24 20* 20*   Hypokalemia/hypomagnesemia -Due to poor oral intake.  Improved with replacement. Recent Labs  Lab 05/04/21 1448 05/06/21 1100 05/08/21 0222 05/09/21 0254 05/10/21 0133  K 3.5 3.8 2.8* 3.4* 3.3*  MG  --   --  1.6* 1.9  --     Discharge Medications:   Allergies as of 05/11/2021   No Known Allergies      Medication List     TAKE these medications    amLODipine 10 MG tablet Commonly known as: NORVASC Take 1 tablet (10 mg total) by mouth daily. Start taking on: May 12, 2021   aspirin 81 MG EC tablet Take 1 tablet (81 mg total) by mouth daily. Swallow whole. Start taking on: May 12, 2021   atorvastatin 10 MG tablet Commonly known  as: LIPITOR Take 1 tablet (10 mg total) by mouth daily. Start taking on: May 12, 2021   divalproex 500 MG DR tablet Commonly known as: Depakote Take 1 tablet (500 mg total) by mouth 2 (two) times daily.   propranolol 20 MG tablet Commonly known as: INDERAL Take 1 tablet (20 mg total) by mouth 2 (two) times daily.        Wound care:     Discharge Instructions:   Discharge Instructions      Call MD for:  difficulty breathing, headache or visual disturbances   Complete by: As directed    Call MD for:  extreme fatigue   Complete by: As directed    Call MD for:  hives   Complete by: As directed    Call MD for:  persistant dizziness or light-headedness   Complete by: As directed    Call MD for:  persistant nausea and vomiting   Complete by: As directed    Call MD for:  severe uncontrolled pain   Complete by: As directed    Call MD for:  temperature >100.4   Complete by: As directed    Diet - low sodium heart healthy   Complete by: As directed    Discharge instructions   Complete by: As directed    General discharge instructions:  Follow with Primary MD Pcp, No in 7 days   Get CBC/BMP checked in next visit within 1 week by PCP or SNF MD. (We routinely change or add medications that can affect your baseline labs and fluid status, therefore we recommend that you get the mentioned basic workup next visit with your PCP, your PCP may decide not to get them or add new tests based on their clinical decision)  On your next visit with your PCP, please get your medicines reviewed and adjusted.  Please request your PCP  to go over all hospital tests, procedures, radiology results at the follow up, please get all Hospital records sent to your PCP by signing hospital release before you go home.  Activity: As tolerated with Full fall precautions use walker/cane & assistance as needed  Avoid using any recreational substances like cigarette, tobacco, alcohol, or non-prescribed drug.  If you experience worsening of your admission symptoms, develop shortness of breath, life threatening emergency, suicidal or homicidal thoughts you must seek medical attention immediately by calling 911 or calling your MD immediately  if symptoms less severe.  You must read complete instructions/literature along with all the possible adverse reactions/side effects for all the medicines you take and that  have been prescribed to you. Take any new medicine only after you have completely understood and accepted all the possible adverse reactions/side effects.   Do not drive, operate heavy machinery, perform activities at heights, swimming or participation in water activities or provide baby sitting services if your were admitted for syncope or siezures until you have seen by Primary MD or a Neurologist and advised to do so again.  Do not drive when taking Pain medications.  Do not take more than prescribed Pain, Sleep and Anxiety Medications  Wear Seat belts while driving.  Please note You were cared for by a hospitalist during your hospital stay. If you have any questions about your discharge medications or the care you received while you were in the hospital after you are discharged, you can call the unit and asked to speak with the hospitalist on call if the hospitalist that took care of you is not available.  Once you are discharged, your primary care physician will handle any further medical issues. Please note that NO REFILLS for any discharge medications will be authorized once you are discharged, as it is imperative that you return to your primary care physician (or establish a relationship with a primary care physician if you do not have one) for your aftercare needs so that they can reassess your need for medications and monitor your lab values.   Increase activity slowly   Complete by: As directed        Follow ups:    Follow-up Information     [facility]. Follow up.   Why: Transportation services needs 2-3 days notice to schedule a ride Contact information: [phone]        [facility] Follow up.   Contact information: [address] [place] [address] [phone]                Discharge Exam:   Vitals:   05/10/21 2341 05/11/21 0318 05/11/21 0840 05/11/21 1140  BP: (!) 154/92 (!) 157/97 (!)  149/91 102/85  Pulse: (!) 59 61 69 66  Resp: 17 19 18 17   Temp: 98 F (36.7 C) 98.7 F (37.1 C) 98.2 F (36.8 C) 98.4 F (36.9 C)  TempSrc:  Oral Oral Oral  SpO2: 97%  99% 100%    There is no height or weight on file to calculate BMI.  General exam: Pleasant, middle-aged Caucasian male.  Looks older for his age. Skin: No rashes, lesions or ulcers. HEENT: soft tissue contusion of the left cheek and chin, present on admission Lungs: Clear to auscultation bilaterally CVS: Regular rate and rhythm, no murmur GI/Abd soft, nontender, nondistended, bowel sound present CNS: Alert, awake, oriented x3 Psychiatry: Mood appropriate Extremities: No pedal edema, no calf tenderness  Time coordinating discharge: 35 minutes   The results of significant diagnostics from this hospitalization (including imaging, microbiology, ancillary and laboratory) are listed below for reference.    Procedures and Diagnostic Studies:   DG Orbits  Result Date: 05/06/2021 CLINICAL DATA:  Altered mental status.  Clearing for MRI EXAM: ORBITS - COMPLETE 4+ VIEW COMPARISON:  None. FINDINGS: There is no evidence of metallic foreign body within the orbits. No significant bone abnormality identified. IMPRESSION: No evidence of metallic foreign body within the orbits. Electronically Signed   By: 14/01/2021 M.D.   On: 05/06/2021 16:06   DG Chest 1 View  Result Date: 05/06/2021 CLINICAL DATA:  Altered mental status. EXAM: CHEST  1 VIEW COMPARISON:  None. FINDINGS: The heart size and mediastinal contours are within normal limits. Both lungs are clear. The visualized skeletal structures are unremarkable. IMPRESSION: No active disease. Electronically Signed   By: 14/01/2021 M.D.   On: 05/06/2021 16:04   CT Head Wo Contrast  Result Date: 05/06/2021 CLINICAL DATA:  Mental status changes EXAM: CT HEAD WITHOUT CONTRAST TECHNIQUE: Contiguous axial images were obtained from the base of the skull through the vertex without  intravenous contrast. COMPARISON:  05/03/2021 FINDINGS: Brain: No evidence of acute infarction, hemorrhage, hydrocephalus, extra-axial collection or mass lesion/mass effect. Periventricular white matter low attenuation as can be seen with microvascular disease which is greater than expected for the patient's age. Bilateral basal ganglia lacunar infarcts. Small lacunar infarct in the left thalamus. Vascular: No hyperdense vessel or unexpected calcification. Skull: No osseous abnormality. Sinuses/Orbits: Visualized paranasal sinuses are clear. Visualized mastoid sinuses are clear. Visualized orbits demonstrate no focal abnormality.  Other: None IMPRESSION: 1. No acute intracranial pathology. 2. Periventricular white matter low attenuation as can be seen with microvascular disease which is greater than expected for the patient's age. Bilateral basal ganglia lacunar infarcts. Small lacunar infarct in the left thalamus. Electronically Signed   By: Elige Ko M.D.   On: 05/06/2021 14:03   MR BRAIN WO CONTRAST  Result Date: 05/06/2021 CLINICAL DATA:  Altered mental status, confusion EXAM: MRI HEAD WITHOUT CONTRAST TECHNIQUE: Multiplanar, multiecho pulse sequences of the brain and surrounding structures were obtained without intravenous contrast. COMPARISON:  No prior MRI, correlation is made with CT head 05/06/2021 FINDINGS: Evaluation is somewhat limited by motion artifact. Brain: No restricted diffusion to suggest acute or subacute infarction. No acute hemorrhage, mass, mass effect, or midline shift. Multiple lacunar infarcts in the bilateral thalami, basal ganglia, and corona radiata. Confluent T2 hyperintense signal in the periventricular white matter and pons, likely the sequela of severe chronic small vessel ischemic disease. Punctate foci of hemosiderin deposition left frontal lobe, right occipital lobe, pons, right thalamus, and left basal ganglia, likely sequela of prior hypertensive microhemorrhages.  Vascular: Normal flow voids. Skull and upper cervical spine: Normal marrow signal. Sinuses/Orbits: Negative. Other: None. IMPRESSION: Evaluation is somewhat limited by motion artifact. Within this limitation, no acute intracranial process. Electronically Signed   By: Wiliam Ke M.D.   On: 05/06/2021 18:07   DG Abd 2 Views  Result Date: 05/06/2021 CLINICAL DATA:  Altered Mental status EXAM: ABDOMEN - 2 VIEW COMPARISON:  None. FINDINGS: The bowel gas pattern is normal. There is no evidence of free air. No radio-opaque calculi or other significant radiographic abnormality is seen. IMPRESSION: Negative. Electronically Signed   By: Marlan Palau M.D.   On: 05/06/2021 16:09   EEG adult  Result Date: 05/07/2021 Jefferson Fuel, MD     05/07/2021  8:36 PM Routine EEG Report Koden Hunzeker is a 50 y.o. male with a history of strokes and seizures who is undergoing an EEG to evaluate for seizures. Report: This EEG was acquired with electrodes placed according to the International 10-20 electrode system (including Fp1, Fp2, F3, F4, C3, C4, P3, P4, O1, O2, T3, T4, T5, T6, A1, A2, Fz, Cz, Pz). The following electrodes were missing or displaced: none. The occipital dominant rhythm was 7-8 Hz. This activity is reactive to stimulation. Drowsiness was manifested by background fragmentation; deeper stages of sleep were identified by K complexes and sleep spindles. There was superimposed bifrontal focal slowing. There were no interictal epileptiform discharges. There were no electrographic seizures identified. Photic stimulation and hyperventilation were not performed. Impression and clinical correlation: This EEG was obtained while awake and asleep and is abnormal due to mild diffuse slowing indicative of global cerebral dysfunction as well as focal bifrontal slowing indicating superimposed focal cerebral dysfunction in these areas. Epileptiform abnormalities were not seen during this recording. Patient will be  monitored on cEEG overnight. Bing Neighbors, MD [facility] [phone] If 7pm- 7am, please page neurology on call as listed in AMION.     Labs:   Basic Metabolic Panel: Recent Labs  Lab 05/04/21 1448 05/06/21 1100 05/08/21 0222 05/09/21 0254 05/10/21 0133  NA 136 140 137 137 138  K 3.5 3.8 2.8* 3.4* 3.3*  CL 105 106 104 107 110  CO2 18* 15* 24 20* 20*  GLUCOSE 83 73 97 101* 80  BUN 25* 23* CREATININE 1.25* 1.06 0.91 0.97 0.84  CALCIUM 9.2 9.0 8.4* 8.9 7.8*  MG  --   --  1.6* 1.9  --    GFR Estimated Creatinine Clearance: 130.6 mL/min (by C-G formula based on SCr of 0.84 mg/dL). Liver Function Tests: Recent Labs  Lab 05/04/21 1448 05/08/21 0222  AST 34 25  ALT 24 26  ALKPHOS 72 56  BILITOT 1.6* 0.8  PROT 6.8 5.3*  ALBUMIN 4.0 2.9*   No results for input(s): LIPASE, AMYLASE in the last 168 hours. No results for input(s): AMMONIA in the last 168 hours. Coagulation profile No results for input(s): INR, PROTIME in the last 168 hours.  CBC: Recent Labs  Lab 05/04/21 1448 05/06/21 1100 05/08/21 0222  WBC 8.1 8.1 6.9  NEUTROABS 5.2 6.8  --   HGB 13.6 13.7 12.0*  HCT 39.2 40.1 34.7*  MCV 85.8 87.6 84.6  PLT 212 225 212   Cardiac Enzymes: No results for input(s): CKTOTAL, CKMB, CKMBINDEX, TROPONINI in the last 168 hours. BNP: Invalid input(s): POCBNP CBG: Recent Labs  Lab 05/07/21 0405 05/07/21 0734 05/10/21 0906 05/10/21 1220 05/10/21 1713  GLUCAP 93 73 86 98 87   D-Dimer No results for input(s): DDIMER in the last 72 hours. Hgb A1c No results for input(s): HGBA1C in the last 72 hours. Lipid Profile No results for input(s): CHOL, HDL, LDLCALC, TRIG, CHOLHDL, LDLDIRECT in the last 72 hours. Thyroid function studies Recent Labs    05/09/21 0254  T3FREE 4.6*   Anemia work up Recent Labs    05/09/21 0254  VITAMINB12 408  FOLATE 7.1  FERRITIN 386*  TIBC 223*  IRON 49  RETICCTPCT 1.0   Microbiology Recent Results  (from the past 240 hour(s))  Resp Panel by RT-PCR (Flu A&B, Covid) Nasopharyngeal Swab     Status: None   Collection Time: 05/06/21  5:06 PM   Specimen: Nasopharyngeal Swab; Nasopharyngeal(NP) swabs in vial transport medium  Result Value Ref Range Status   SARS Coronavirus 2 by RT PCR NEGATIVE NEGATIVE Final    Comment: (NOTE) SARS-CoV-2 target nucleic acids are NOT DETECTED.  The SARS-CoV-2 RNA is generally detectable in upper respiratory specimens during the acute phase of infection. The lowest concentration of SARS-CoV-2 viral copies this assay can detect is 138 copies/mL. A negative result does not preclude SARS-Cov-2 infection and should not be used as the sole basis for treatment or other patient management decisions. A negative result may occur with  improper specimen collection/handling, submission of specimen other than nasopharyngeal swab, presence of viral mutation(s) within the areas targeted by this assay, and inadequate number of viral copies(<138 copies/mL). A negative result must be combined with clinical observations, patient history, and epidemiological information. The expected result is Negative.  Fact Sheet for Patients:  BloggerCourse.com  Fact Sheet for Healthcare Providers:  SeriousBroker.it  This test is no t yet approved or cleared by the [place] FDA and  has been authorized for detection and/or diagnosis of SARS-CoV-2 by FDA under an Emergency Use Authorization (EUA). This EUA will remain  in effect (meaning this test can be used) for the duration of the COVID-19 declaration under Section 564(b)(1) of the Act, 21 U.S.C.section 360bbb-3(b)(1), unless the authorization is terminated  or revoked sooner.       Influenza A by PCR NEGATIVE NEGATIVE Final   Influenza B by PCR NEGATIVE NEGATIVE Final    Comment: (NOTE) The Xpert Xpress SARS-CoV-2/FLU/RSV plus assay is intended as an aid in the  diagnosis of influenza from Nasopharyngeal swab specimens and should not be used as a sole basis for treatment. Nasal washings and aspirates are  unacceptable for Xpert Xpress SARS-CoV-2/FLU/RSV testing.  Fact Sheet for Patients: BloggerCourse.com  Fact Sheet for Healthcare Providers: SeriousBroker.it  This test is not yet approved or cleared by the [place] FDA and has been authorized for detection and/or diagnosis of SARS-CoV-2 by FDA under an Emergency Use Authorization (EUA). This EUA will remain in effect (meaning this test can be used) for the duration of the COVID-19 declaration under Section 564(b)(1) of the Act, 21 U.S.C. section 360bbb-3(b)(1), unless the authorization is terminated or revoked.  Performed at [facility], [address] [place], [place] [address]      Signed: Melina Schools Caralynn Gelber  [facility] 05/11/2021, 12:42 PM

## 2021-05-12 ENCOUNTER — Other Ambulatory Visit: Payer: Self-pay

## 2021-05-12 LAB — BASIC METABOLIC PANEL
Anion gap: 10 (ref 5–15)
BUN: 26 mg/dL — ABNORMAL HIGH (ref 6–20)
CO2: 22 mmol/L (ref 22–32)
Calcium: 9.3 mg/dL (ref 8.9–10.3)
Chloride: 105 mmol/L (ref 98–111)
Creatinine, Ser: 1.02 mg/dL (ref 0.61–1.24)
GFR, Estimated: >60 mL/min (ref >60)
Glucose, Bld: 88 mg/dL (ref 70–99)
Potassium: 4.1 mmol/L (ref 3.5–5.1)
Sodium: 137 mmol/L (ref 135–145)

## 2021-05-12 LAB — VALPROIC ACID LEVEL: Valproic Acid Lvl: 38 ug/mL — ABNORMAL LOW (ref 50.0–100.0)

## 2021-05-12 LAB — VITAMIN B1: Vitamin B1 (Thiamine): 92 nmol/L (ref 66.5–200.0)

## 2021-05-12 MED ORDER — VALPROATE SODIUM 100 MG/ML IV SOLN
500.0000 mg | Freq: Once | INTRAVENOUS | Status: AC
  Administered 2021-05-12: 500 mg via INTRAVENOUS
  Filled 2021-05-12: qty 5

## 2021-05-12 MED ORDER — THIAMINE HCL 100 MG/ML IJ SOLN
500.0000 mg | Freq: Three times a day (TID) | INTRAVENOUS | Status: DC
  Administered 2021-05-12 (×2): 500 mg via INTRAVENOUS
  Filled 2021-05-12 (×4): qty 5

## 2021-05-12 MED ORDER — THIAMINE HCL 100 MG/ML IJ SOLN: 100.0000 mg | Freq: Every day | INTRAMUSCULAR | Status: DC

## 2021-05-12 MED ORDER — THIAMINE HCL 100 MG/ML IJ SOLN: 250.0000 mg | Freq: Every day | INTRAVENOUS | Status: DC

## 2021-05-12 NOTE — Progress Notes (Signed)
PROGRESS NOTE  Arthur [name]  DOB: July 16, 1970  PCP: Kathyrn Lass RKY:[id number]  DOA: 05/06/2021  LOS: 5 days  Hospital Day: 7  Chief Complaint  Patient presents with   Hypoglycemia   Hypertension    Brief narrative: Arthur [name] is a 50 y.o. male with PMH significant for stroke, homelessness, and possibly seizure disorder who has had 3 visits to the emergency department in 3 days with seizure activity. Subsequently hospitalized for further management.   Seen by neurology and psychiatry. See below for details.   Subjective: Patient was seen and examined this morning.  Lying on bed.  Not in distress.  No new symptoms.  Not agitated or restless today.  Assessment/Plan: Acute metabolic encephalopathy Seizure disorder -Presented with 3 days of seizure activity  -MRI brain was negative for acute findings but showed chronic infarcts.   -Neurology consultation was obtained.  Patient was maintained on continuous EEG.   -Patient was previously supposed to be on Depakote but was noncompliant to it. It was initially held because of mental status change and subsequently resumed.    Acute metabolic encephalopathy -Patient had episodes of agitation in the hospital.  Per discussion with her daughter, it was noted that patient currently has a different personality than his baseline.  Neurology and psychiatry consultation was obtained. -Thiamine level with sent.  It may take long time to result.  Replacement started. -HIV negative on admission.  RPR and ammonia level ordered.  History of stroke -Chronic infarcts noted in MRI.  Started on aspirin and statin.   Essential hypertension -Blood pressure currently controlled on propanolol, amlodipine and hydralazine.     Hyperthyroidism -Patient without any overt signs or symptoms of hyperthyroidism.  Does not have any thyroid enlargement on examination.   -TSH noted to be suppressed to less than 0.01.  Free T4 noted to be elevated at 2.74.   Free T3 level was also noted to be elevated at 4.6.thyroid-stimulating immunoglobulin level is pending.   -Because of homelessness and medical noncompliance, patient was not started on antithyroid medications.  He has however been initiated on propanolol.  Patient does not seem to have any symptoms per se related to hyperthyroidism. Recent Labs    05/08/21 0222  TSH <0.010*    Minimally displaced fracture of the left nasal bone -This is secondary to fall he sustained after seizure several days ago.  He also has soft tissue contusion of the left cheek and chin.  Outpatient follow-up with ENT/Plastics as needed.   -No need for any intervention based on the previous hospitalist's discussion with Dr. Marcelline Deist with ENT   AKI  Acute metabolic acidosis -Creatinine was elevated to 1.31, improved with hydration.  Serum bicarb level improved as well. Recent Labs    05/03/21 0922 05/04/21 1448 05/06/21 1100 05/08/21 0222 05/09/21 0254 05/10/21 0133 05/12/21 0247  BUN 24* 25* 23* _0 26*  CREATININE 1.31* 1.25* 1.06 0.91 0.97 0.84 1.02  CO2 17* 18* 15* 24 20* 20* 22    Hypokalemia/hypomagnesemia -Due to poor oral intake.  Improved with replacement. Recent Labs  Lab 05/06/21 1100 05/08/21 0222 05/09/21 0254 05/10/21 0133 05/12/21 0247  K 3.8 2.8* 3.4* 3.3* 4.1  MG  --  1.6* 1.9  --   --    Mobility: Encourage ambulation Living condition: Homeless prior to admission Goals of care:   Code Status: Full Code  Nutritional status: There is no height or weight on file to calculate BMI.  Nutrition Problem: Severe Malnutrition  Etiology: social / environmental circumstances Signs/Symptoms: severe muscle depletion, severe fat depletion Diet:  Diet Order             Diet - low sodium heart healthy           Diet Heart Room service appropriate? Yes; Fluid consistency: Thin  Diet effective now                  DVT prophylaxis:  enoxaparin (LOVENOX) injection 40 mg Start:  05/06/21 2200   Antimicrobials: None Fluid: None Consultants: Neurology, psychiatry Family Communication: Daughter not at bedside  Status is: Inpatient  Continue in-hospital care because: Unable secondary evaluation, encephalopathy work-up Level of care: Telemetry Medical   Dispo: The patient is from: Homeless              Anticipated d/c is to: Tentative plan to discharge to home with daughter.              Patient currently is not medically stable to d/c.   Difficult to place patient No     Infusions:   thiamine injection 500 mg (05/12/21 1334)   Followed by   Derrill Memo ON 05/14/2021] thiamine injection      Scheduled Meds:  amLODipine  10 mg Oral Daily   aspirin EC  81 mg Oral Daily   atorvastatin  10 mg Oral Daily   chlorhexidine gluconate (MEDLINE KIT)  15 mL Mouth Rinse BID   divalproex  500 mg Oral BID   enoxaparin (LOVENOX) injection  40 mg Subcutaneous Q24H   feeding supplement  237 mL Oral TID BM   hydrALAZINE  50 mg Oral Q8H   mouth rinse  15 mL Mouth Rinse 10 times per day   multivitamin with minerals  1 tablet Oral Daily   propranolol  20 mg Oral BID   [START ON 05/20/2021] thiamine injection  100 mg Intravenous Daily    PRN meds: acetaminophen **OR** acetaminophen, dextrose, labetalol, ondansetron **OR** ondansetron (ZOFRAN) IV   Antimicrobials: Anti-infectives (From admission, onward)    None       Objective: Vitals:   05/12/21 1040 05/12/21 1221  BP: (!) 113/92 105/68  Pulse: 82 70  Resp:  17  Temp:  98.2 F (36.8 C)  SpO2:  100%    Intake/Output Summary (Last 24 hours) at 05/12/2021 1440 Last data filed at 05/12/2021 1149 Gross per 24 hour  Intake --  Output 1000 ml  Net -1000 ml   There were no vitals filed for this visit. Weight change:  There is no height or weight on file to calculate BMI.   Physical Exam: General exam: Pleasant, middle-aged Caucasian male.  Not in physical distress Skin: No rashes, lesions or ulcers.   Dried up scabs in face and knuckles HEENT: Atraumatic, normocephalic, no obvious bleeding Lungs: Clear to auscultation bilaterally CVS: Regular rate and rhythm, no murmur GI/Abd soft, nontender, nondistended, bowel sound present CNS: Alert, awake, oriented x3 Psychiatry: Mood appropriate Extremities: No pedal edema, no calf tenderness  Data Review: I have personally reviewed the laboratory data and studies available.  F/u labs ordered Unresulted Labs (From admission, onward)     Start     Ordered   05/13/21 0500  Ammonia  Tomorrow morning,   R       Question:  Specimen collection method  Answer:  Lab=Lab collect   05/12/21 1431   05/13/21 0500  RPR  Tomorrow morning,   R       Question:  Specimen collection method  Answer:  Lab=Lab collect   05/12/21 1431   05/13/21 9518  Basic metabolic panel  Tomorrow morning,   R       Question:  Specimen collection method  Answer:  Lab=Lab collect   05/12/21 1431   05/13/21 0500  CBC with Differential/Platelet  Tomorrow morning,   R       Question:  Specimen collection method  Answer:  Lab=Lab collect   05/12/21 1431   05/11/21 1217  Vitamin B1  ONCE - STAT,   STAT       Comments: Please obtain prior to administering any IV or PO thiamine   Question Answer Comment  Specimen collection method Lab=Lab collect   Release to patient Immediate      05/11/21 1216            Signed, Terrilee Croak, MD [facility] 05/12/2021

## 2021-05-12 NOTE — Progress Notes (Signed)
Physical Therapy Treatment Patient Details Name: Arthur [name] MRN: [id number] DOB: Nov 17, 1970 Today's Date: 05/12/2021   History of Present Illness Patient is a 50 y/o male admitted after three visits to ED in 3 days due to seizures.  PMH positive for CVA and seizure disorder, but not on meds.  Patient is homeless and found to have acute metabolic encephalopathy, high blood pressure, hypokalemia, hypomagnesemia and min displaced L nasal bone fracture.    PT Comments    Patient progressing slowly.  Remains min A for ambulation with walker still veering to L and noting memory issues that will impact safety.  Spoke with pt and RN about ensuring daughter knows he will need assistance for ambulation and for managing his medications and appointments etc.  Patient will benefit most from follow up outpatient PT at d/c.  Will continue to follow if not d/c.    Recommendations for follow up therapy are one component of a multi-disciplinary discharge planning process, led by the attending physician.  Recommendations may be updated based on patient status, additional functional criteria and insurance authorization.  Follow Up Recommendations  Outpatient PT     Assistance Recommended at Discharge Frequent or constant Supervision/Assistance  Equipment Recommendations  Rolling walker (2 wheels)    Recommendations for Other Services       Precautions / Restrictions Precautions Precautions: Fall Precaution Comments: seizure     Mobility  Bed Mobility   Bed Mobility: Supine to Sit;Sit to Supine     Supine to sit: Supervision;HOB elevated Sit to supine: Supervision   General bed mobility comments: assist for lines/safety    Transfers Overall transfer level: Needs assistance Equipment used: Straight cane;Rolling walker (2 wheels) Transfers: Sit to/from Stand Sit to Stand: Min guard           General transfer comment: assist for balance    Ambulation/Gait Ambulation/Gait  assistance: Min assist;Mod assist;Min guard Gait Distance (Feet): 120 Feet (100) Assistive device: Straight cane;Rolling walker (2 wheels) Gait Pattern/deviations: Step-to pattern;Step-through pattern;Decreased stride length;Shuffle       General Gait Details: initially with cane and two LOB needing mod A for recovery; then with RW and minguard to min A for walker management at times due to veering L   Stairs Stairs: Yes Stairs assistance: Min guard;Supervision Stair Management: Alternating pattern;Two rails;Forwards Number of Stairs: 4 General stair comments: forward with both rails and CGA to S for safety for balance   Wheelchair Mobility    Modified Rankin (Stroke Patients Only)       Balance Overall balance assessment: Needs assistance Sitting-balance support: Feet supported Sitting balance-Leahy Scale: Good     Standing balance support: Single extremity supported Standing balance-Leahy Scale: Fair Standing balance comment: static balance without UE support, but needs walker for safest ambulation                            Cognition Arousal/Alertness: Awake/alert Behavior During Therapy: WFL for tasks assessed/performed Overall Cognitive Status: Impaired/Different from baseline Area of Impairment: Orientation;Memory;Safety/judgement;Attention                 Orientation Level: Disoriented to;Place Current Attention Level: Sustained Memory: Decreased short-term memory   Safety/Judgement: Decreased awareness of deficits;Decreased awareness of safety   Problem Solving: Slow processing;Requires verbal cues General Comments: still did not remember name of the hospital, did not know where clothes came from that were folded, clean on the chair in the room, did not know how  long he had been here even after told him his date of admission and what today's date was        Exercises      General Comments General comments (skin integrity, edema,  etc.): mother called twice with PT in the room, pt reports she talks a lot and didn't give him time to reply, but seems she is not well with pt plans to go live with his daughter.  Patient educated and RN aware pt will need close assist for safety with walker when walking in the home and will need med management and appointment management for safe d/c.      Pertinent Vitals/Pain Pain Assessment: No/denies pain    Home Living                          Prior Function            PT Goals (current goals can now be found in the care plan section) Progress towards PT goals: Progressing toward goals    Frequency    Min 3X/week      PT Plan Current plan remains appropriate    Co-evaluation              AM-PAC PT "6 Clicks" Mobility   Outcome Measure  Help needed turning from your back to your side while in a flat bed without using bedrails?: None Help needed moving from lying on your back to sitting on the side of a flat bed without using bedrails?: None Help needed moving to and from a bed to a chair (including a wheelchair)?: A Little Help needed standing up from a chair using your arms (e.g., wheelchair or bedside chair)?: A Little Help needed to walk in hospital room?: A Little Help needed climbing 3-5 steps with a railing? : A Little 6 Click Score: 20    End of Session Equipment Utilized During Treatment: Gait belt Activity Tolerance: Patient tolerated treatment well Patient left: in bed;with call bell/phone within reach;with bed alarm set   PT Visit Diagnosis: Other abnormalities of gait and mobility (R26.89);Muscle weakness (generalized) (M62.81);Other symptoms and signs involving the nervous system (R29.898)     Time: 3664-4034 PT Time Calculation (min) (ACUTE ONLY): 30 min  Charges:  $Gait Training: 8-22 mins $Self Care/Home Management: 8-22                     Sheran Lawless, PT Acute Rehabilitation  Services Pager:[phone] Office:[phone] 05/12/2021    Elray Mcgregor 05/12/2021, 11:49 AM

## 2021-05-12 NOTE — Consult Note (Signed)
Reason for Consult: AMS, Family report that patient is different person Referring Physician: Terrilee Croak, MD   Assessment/Plan: Arthur [name] is a 50 y.o. [name] admitted medically for 05/06/2021 10:20 AM for Seizures.  He carries the self-reported psychiatric diagnoses of Depression and Anxiety and has a past medical history of  Stroke and Seizures.  Psychiatry was consulted for AMS, Family report that patient is different person.   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  Outpatient psychotropic medications include potentially Wellbutrin, Depakote, and Prolixin and historically has had an unknown response to these medications.  He was not compliant with medications prior to admission as evidenced by his admission he has not taken his medications in about a year.    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  At this point we will not recommend starting any antipsychotics.  If patient begins to get agitated or combative we could consider adding a low-dose antipsychotic at that point.  We will recommend patient have an ammonia level and RPR drawn.  Also recommend patient start thiamine supplementation as the lab level has already been drawn because even though there is not a significant history of alcohol use this is a relatively low risk with potentially high reward intervention.   Recommendations: -Obtain RPR and Ammonia Level -Start Thiamin Supplementation   Continue rest of care per Primary Team These recommendations have been discussed with the Primary Team Psychiatry will continue to follow the patient    Arthur [name] is an 50 y.o. [name].  HPI: Arthur [name]  is a 50 y.o. [name] with medical history significant of stroke, possibly seizures.  Pt homeless.  Found down by EMS laying in parking lot in rain.  BGL 59.  Denies EtOH or substance abuse other than smoking.  Patient states he had a stroke in the past.  He said he has had unresponsive episodes but never been diagnosed with seizures.  3 ED visits in 3 days with seizures during last 2.  Tried to start him on depakote but never picked this med up.  Having apparent seizures at times in ED.   Patient is overall poor historian.  Begin the interview with orientation questions and patient was oriented to person, place (did not know the name of the hospital, however, because he is from [place] this is understandable did know that he was in the hospital in [place], [place]), time, and situation.  He was able to list the current and 2 former presidents.  He is able to answer all 4 ICAM questions correctly.  When asked to list days of the week backwards starting with Sunday he said Saturday, Friday and then paused for about 5 seconds before saying Saturday again pausing for a few more seconds and then saying Friday and then pausing and not continuing.  When asked to count down from 42-27 he counted 42, 41, 40 and then paused for several seconds then said 41 pausing again for several seconds and then saying 40 and then 41 again and then 40 before stopping.  When asked what happened he states he does not remember.  He reports he had a seizure but that was about all he remembers.  He states that he is here in [place] because he was  moving from [place] to live with his daughter who lives here.  When asked how he felt he was doing he states his "speech sucks" and that he just feels different.  He states that prior to this he was much better.  He states he has had issues with memory and ADLs since his stroke which happened about a year and 1/2 to 2 years ago.  He reports that he started having seizures about a year  ago and that the last time he took medications for it was about a year ago.  He reports past psychiatric history of depression and anxiety.  He states that he took medications but it has been at least a year since he took any medications for it and he does not remember the names.  He reports no prior psychiatric hospitalizations.  He reports no suicide attempts or self-harm behavior.  He reports that he has had some some hallucinations but has not had any for several years.  When asked for family psychiatric history he reports that his maternal uncle and brother have schizophrenia.  He reports 2 daughters and 1 son who had no known to him medical issues.  He reports that he drinks about a 12 pack in 1 week.  He reports that he does smoke cigarettes, however, when I asked him to quantify the amount he stated "in 1 week about a month."  He reports no substance abuse in the past.  He reports no SI, HI, or AVH.   Called patient's daughter Arthur [name] [phone].  She confirms that the patient had been moving from [place] to live with her and her significant other.  She states that she last saw him in July in [place].  She states that he seemed normal and so much that he was able to hug her and was coherent it is in thinking.  She states that over the last few weeks she noticed his speech quality was going down.  She states that when they had a video chat on December 1 she asked him what was wrong and he could not answer.  She states that by December 3 when he got on the bus in [place] he was significantly worse.  She says the last contact she had from him was on December 4 when he texted her to say his phone had to present battery.  When asked about his history she states that she has not really had a significant amount of interactions with him.  She states she is only ever physically seen him 4 times the first of which being when she was 13 when he first establish contact with her.  She states that they  have been talking on the phone regularly for a little while now.  She states that to her knowledge his past psychiatric history consists of schizophrenia, bipolar disorder, and depression.  She reports that she has never seen the patient in a psychotic state.  She states that to her knowledge he has never been psychiatrically hospitalized.  She then asked what the patient and said and stated that since this was a long-term memory he was probably correct and what he told me since his long-term memory tends to be okay.    Past Medical History:  Diagnosis Date   Homeless    Stroke [facility])     History reviewed. No pertinent surgical history.  Family History  Problem Relation Age of Onset   Seizures Neg Hx     Social History:  reports that he has been smoking cigarettes. He does not have any smokeless tobacco history on file. He reports that he does not currently use alcohol. He reports that he does not currently use drugs.  Allergies: No Known Allergies  Medications: I have reviewed the patient's current medications. Prior to Admission:  Medications Prior to Admission  Medication Sig Dispense Refill Last Dose   divalproex (DEPAKOTE) 500 MG DR tablet Take 1 tablet (500 mg total) by mouth 2 (two) times daily. (Patient not taking: Reported on 05/06/2021) 60 tablet 2 Unknown   Scheduled:  amLODipine  10 mg Oral Daily   aspirin EC  81 mg Oral Daily   atorvastatin  10 mg Oral Daily   chlorhexidine gluconate (MEDLINE KIT)  15 mL Mouth Rinse BID   divalproex  500 mg Oral BID   enoxaparin (LOVENOX) injection  40 mg Subcutaneous Q24H   feeding supplement  237 mL Oral TID BM   hydrALAZINE  50 mg Oral Q8H   mouth rinse  15 mL Mouth Rinse 10 times per day   multivitamin with minerals  1 tablet Oral Daily   propranolol  20 mg Oral BID   [START ON 05/20/2021] thiamine injection  100 mg Intravenous Daily   Continuous:  thiamine injection     Followed by   Derrill Memo ON 05/14/2021] thiamine injection      ONG:[id number] **OR** acetaminophen, dextrose, labetalol, ondansetron **OR** ondansetron (ZOFRAN) IV Anti-infectives (From admission, onward)    None       Results for orders placed or performed during the hospital encounter of 05/06/21 (from the past 48 hour(s))  Glucose, capillary     Status: None   Collection Time: 05/10/21  5:13 PM  Result Value Ref Range   Glucose-Capillary 87 70 - 99 mg/dL    Comment: Glucose reference range applies only to samples taken after fasting for at least 8 hours.  Valproic acid level     Status: Abnormal   Collection Time: 05/12/21  2:47 AM  Result Value Ref Range   Valproic Acid Lvl 38 (L) 50.0 - 100.0 ug/mL    Comment: Performed at [facility] [address], [place], [place] [address]  Basic metabolic panel     Status: Abnormal   Collection Time: 05/12/21  2:47 AM  Result Value Ref Range   Sodium 137 135 - 145 mmol/L   Potassium 4.1 3.5 - 5.1 mmol/L   Chloride 105 98 - 111 mmol/L   CO2 22 22 - 32 mmol/L   Glucose, Bld 88 70 - 99 mg/dL    Comment: Glucose reference range applies only to samples taken after fasting for at least 8 hours.   BUN 26 (H) 6 - 20 mg/dL   Creatinine, Ser 1.02 0.61 - 1.24 mg/dL   Calcium 9.3 8.9 - 10.3 mg/dL   GFR, Estimated >60 >60 mL/min    Comment: (NOTE) Calculated using the CKD-EPI Creatinine Equation (2021)    Anion gap 10 5 - 15    Comment: Performed at [facility] [address], [place], [place] [address]    No results found.  Blood pressure 105/68, pulse 70, temperature 98.2 F (36.8 C), temperature source Oral, resp. rate 17, SpO2 100 %. Psychiatric Specialty Exam: Physical Exam Vitals and nursing note reviewed.  Constitutional:      General: He is not in acute distress.    Appearance: He is normal weight. He is not ill-appearing or toxic-appearing.  HENT:  Head: Normocephalic.  Pulmonary:     Effort: Pulmonary effort is normal.  Musculoskeletal:        General:  Normal range of motion.  Neurological:     General: No focal deficit present.     Mental Status: He is alert.    Review of Systems  Respiratory:  Negative for cough and shortness of breath.   Cardiovascular:  Negative for chest pain.  Gastrointestinal:  Negative for abdominal pain, constipation, diarrhea, nausea and vomiting.  Neurological:  Negative for weakness and headaches.  Psychiatric/Behavioral:  Negative for agitation, hallucinations, sleep disturbance and suicidal ideas.    Blood pressure 105/68, pulse 70, temperature 98.2 F (36.8 C), temperature source Oral, resp. rate 17, SpO2 100 %.There is no height or weight on file to calculate BMI.  General Appearance: Casual, Disheveled, and has injuries to left orbit and hands, multiple tattoos  Eye Contact:  Fair  Speech:  Normal Rate and about 60% clear speech with 40% slurred  Volume:  Normal  Mood:   "ok"  Affect:  Congruent  Thought Process:  Coherent  Orientation:  Full (Time, Place, and Person) and situation  Thought Content:  Logical  Suicidal Thoughts:  No  Homicidal Thoughts:  No  Memory:  Immediate;   Fair Recent;   Poor Remote;   Fair  Judgement:  Intact  Insight:  Present  Psychomotor Activity:  Restlessness and fidgeting  Concentration:  Concentration: Poor and Attention Span: Poor  Recall:  Poor  Fund of Knowledge:  Poor  Language:  Fair  Akathisia:  Negative  Handed:  Right  AIMS (if indicated):     Assets:  Resilience Social Support  ADL's:  Impaired  Cognition:  WNL  Sleep:   fair     Briant Cedar 05/12/2021, 12:44 PM

## 2021-05-12 NOTE — TOC Progression Note (Addendum)
Transition of Care [facility]) - Progression Note    Patient Details  Name: Arthur [name] MRN: [id number] Date of Birth: [birth year]  [facility] CM/SW Contact  Kermit Balo, RN Phone Number: 05/12/2021, 11:27 AM  Clinical Narrative:    Patient's mother has located him at [facility] and called to speak with CM. CM obtained permission from the patient and reached out to patients mother. She lives in [place] and was worried about him since no one has heard from him on his trip from [place] to [place]. She is asking to be primary contact and concerned about him discharging to daughters when medically ready. CM talked with patient and he is still on board with discharging to his daughters and he doesn't want the primary contact changed. Mother states pt has a sister that lives in [place] but she is not sure where. She is going to see if she can find out.  TOC following.  1156: outpatient therapy arranged at [facility] as this is closer to daughters address. CM has sent in rider waiver for [employer] and entered his information for transport to [facility] appointments.    Expected Discharge Plan: Homeless Shelter Barriers to Discharge: Continued Medical Work up  Expected Discharge Plan and Services Expected Discharge Plan: Homeless Shelter   Discharge Planning Services: CM Consult   Living arrangements for the past 2 months: Homeless Expected Discharge Date: 05/11/21                                     Social Determinants of Health (SDOH) Interventions    Readmission Risk Interventions No flowsheet data found.

## 2021-05-12 NOTE — Progress Notes (Signed)
Neurology Progress Note  S: No acute changes overnight Seen and examined in the room-psychiatry resident physician interviewing the patient at the time.  O: Current vital signs: BP (!) 129/95 (BP Location: Left Arm)    Pulse (!) 53    Temp 98 F (36.7 C) (Oral)    Resp 17    SpO2 100%  Vital signs in last 24 hours: Temp:  [97.6 F (36.4 C)-98.4 F (36.9 C)] 98 F (36.7 C) (12/15 0751) Pulse Rate:  [53-82] 53 (12/15 0327) Resp:  [16-18] 17 (12/15 0751) BP: (102-140)/(65-95) 129/95 (12/15 0751) SpO2:  [100 %] 100 % (12/15 0751) General: Awake alert in no distress HEENT: Abrasions on the left side of the face Lungs: Clear Cardiovascular: Regular rate rhythm Extremities warm well perfused with abrasions on the knuckles Neurological exam Awake alert oriented to self Oriented to the fact that he is in the hospital Still could not tell me the correct month Attention concentration still seems to be reduced Speech is clear Cranial nerves II to XII intact-no ptosis noted today Motor exam with antigravity strength in all 4 extremities Sensation intact to all touch modalities in all 4 extremities Coordination exam reveals no dysmetria  Medications  Current Facility-Administered Medications:    acetaminophen (TYLENOL) tablet 650 mg, 650 mg, Oral, Q4H PRN, 650 mg at 05/11/21 0820 **OR** acetaminophen (TYLENOL) suppository 650 mg, 650 mg, Rectal, Q4H PRN, Alcario Drought, Jared M, DO   amLODipine (NORVASC) tablet 10 mg, 10 mg, Oral, Daily, Bonnielee Haff, MD, 10 mg at 05/11/21 7517   aspirin EC tablet 81 mg, 81 mg, Oral, Daily, Bonnielee Haff, MD, 81 mg at 05/11/21 0926   atorvastatin (LIPITOR) tablet 10 mg, 10 mg, Oral, Daily, Bonnielee Haff, MD, 10 mg at 05/11/21 0926   chlorhexidine gluconate (MEDLINE KIT) (PERIDEX) 0.12 % solution 15 mL, 15 mL, Mouth Rinse, BID, Alcario Drought, Jared M, DO, 15 mL at 05/11/21 2239   dextrose (GLUTOSE) 40 % oral gel 31 g, 1 Tube, Oral, PRN, Hayden Rasmussen, MD,  31 g at 05/06/21 1457   divalproex (DEPAKOTE) DR tablet 500 mg, 500 mg, Oral, BID, Alcario Drought, Jared M, DO, 500 mg at 05/11/21 2230   enoxaparin (LOVENOX) injection 40 mg, 40 mg, Subcutaneous, Q24H, Alcario Drought, Jared M, DO, 40 mg at 05/11/21 2231   feeding supplement (ENSURE ENLIVE / ENSURE PLUS) liquid 237 mL, 237 mL, Oral, TID BM, Bonnielee Haff, MD, 237 mL at 05/11/21 2229   hydrALAZINE (APRESOLINE) tablet 50 mg, 50 mg, Oral, Q8H, Bonnielee Haff, MD, 50 mg at 05/12/21 0017   labetalol (NORMODYNE) injection 10-20 mg, 10-20 mg, Intravenous, Q2H PRN, Alcario Drought, Jared M, DO, 20 mg at 05/10/21 0450   MEDLINE mouth rinse, 15 mL, Mouth Rinse, 10 times per day, Etta Quill, DO, 15 mL at 05/12/21 4944   multivitamin with minerals tablet 1 tablet, 1 tablet, Oral, Daily, Bonnielee Haff, MD, 1 tablet at 05/11/21 0926   ondansetron (ZOFRAN) tablet 4 mg, 4 mg, Oral, Q6H PRN **OR** ondansetron (ZOFRAN) injection 4 mg, 4 mg, Intravenous, Q6H PRN, Alcario Drought, Jared M, DO   propranolol (INDERAL) tablet 20 mg, 20 mg, Oral, BID, Bonnielee Haff, MD, 20 mg at 05/11/21 2231   valproate (DEPACON) 500 mg in dextrose 5 % 50 mL IVPB, 500 mg, Intravenous, Once, Amie Portland, MD  Labs CBC    Component Value Date/Time   WBC 6.9 05/08/2021 0222   RBC 4.43 05/09/2021 0254   RBC 4.10 (L) 05/08/2021 0222   HGB 12.0 (L) 05/08/2021  0222   HCT 34.7 (L) 05/08/2021 0222   PLT 212 05/08/2021 0222   MCV 84.6 05/08/2021 0222   MCH 29.3 05/08/2021 0222   MCHC 34.6 05/08/2021 0222   RDW 12.6 05/08/2021 0222   LYMPHSABS 0.8 05/06/2021 1100   MONOABS 0.5 05/06/2021 1100   EOSABS 0.0 05/06/2021 1100   BASOSABS 0.0 05/06/2021 1100   BMP Latest Ref Rng & Units 05/12/2021 05/10/2021 05/09/2021  Glucose 70 - 99 mg/dL 88 80 101(H)  BUN 6 - 20 mg/dL 26(H) 10 11  Creatinine 0.61 - 1.24 mg/dL 1.02 0.84 0.97  Sodium 135 - 145 mmol/L 137 138 137  Potassium 3.5 - 5.1 mmol/L 4.1 3.3(L) 3.4(L)  Chloride 98 - 111 mmol/L 105 110 107  CO2  22 - 32 mmol/L 22 20(L) 20(L)  Calcium 8.9 - 10.3 mg/dL 9.3 7.8(L) 8.9   Valproate level 38   Imaging Personally reviewed:  CT Head 1. No acute intracranial pathology. 2. Periventricular white matter low attenuation as can be seen with microvascular disease which is greater than expected for the patient's age. Bilateral basal ganglia lacunar infarcts. Small lacunar infarct in the left thalamus.  MRI Brain  Evaluation is somewhat limited by motion artifact. Within this limitation, no acute intracranial process.  Assessment:  50 year old male who presents with recurrent episodes of decreased responsiveness of unclear etiology. We are searching for EEG correlation to a spell. His rEEG shows diffuse cerebral dysfunction but no epileptiform discharges.  Remains somewhat encephalopathic although remains oriented x2 and also oriented x3 at some times but still does not feel like a normal 50 year old during conversations with him.  Reports having had seizures for many years with inability to take medications. Suspect prolonged postictal state or contribution from it leading to current mentation. Multiple days of LTM EEG unremarkable-was discontinued yesterday. 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  She reports that he has severe mental health issues including schizophrenia at baseline-unclear if he is on any medications. Again it does not seem like he is back to the normal baseline mentation of a 50 year old and his daughter agrees.  Wonder if untreated psychiatric illness is also playing a role in this and would appreciate involvement of psychiatry and their recommendations. Although no significant alcohol history-I will treat him for Wernicke's after checking the thiamine level.  Impression: -episodes of LOC of unknown etiology.  -History of multiple ED visits with seizure like activity.  History of seizure per  daughter. -History of amphetamine abuse -History of schizophrenia and mental health disorders per family. -?  Wernicke's  Recommendations/Plan:  -Continue Depakote 500 twice daily-additional 500 mg IV today as level remains subtherapeutic -Maintain seizure precautions -Psychiatry consultation-appreciated -Replete thiamine-Wernicke's protocol.  Plan d/w hospitalist and psychiatry team.  -- Amie Portland, MD Neurologist [facility] Pager: [phone]

## 2021-05-12 NOTE — Evaluation (Signed)
Occupational Therapy Evaluation Patient Details Name: Arthur [name] MRN: [id number] DOB: 1970-08-28 Today's Date: 05/12/2021   History of Present Illness Patient is a 50 y/o male admitted after three visits to ED in 3 days due to seizures.  PMH positive for CVA and seizure disorder, but not on meds.  Patient is homeless and found to have acute metabolic encephalopathy, high blood pressure, hypokalemia, hypomagnesemia and min displaced L nasal bone fracture.   Clinical Impression   Pt admitted for concerns listed above. PTA pt reported that he was independent with all ADL's and IADL's. At this time, pt requiring max verbal cuing, and at times tactile cuing as well for initiation. Overall, pt requiring min-mod A for all ADL's and functional mobility. Additionally, pt has some visual deficits on the L side, noted when ambulating. Unable to assess vision at this time further due to cognition. Recommend Out Patient OT services at this time to maximize independence, and OT will follow acutely to address concerns below.       Recommendations for follow up therapy are one component of a multi-disciplinary discharge planning process, led by the attending physician.  Recommendations may be updated based on patient status, additional functional criteria and insurance authorization.   Follow Up Recommendations  Outpatient OT    Assistance Recommended at Discharge Intermittent Supervision/Assistance  Functional Status Assessment  Patient has had a recent decline in their functional status and demonstrates the ability to make significant improvements in function in a reasonable and predictable amount of time.  Equipment Recommendations  None recommended by OT    Recommendations for Other Services       Precautions / Restrictions Precautions Precautions: Fall Precaution Comments: seizure Restrictions Weight Bearing Restrictions: No      Mobility Bed Mobility Overal bed mobility: Needs  Assistance Bed Mobility: Supine to Sit     Supine to sit: Mod assist     General bed mobility comments: Pt requiring mod assist due to cognition, difficulty sequencing.    Transfers Overall transfer level: Needs assistance Equipment used: Rolling walker (2 wheels) Transfers: Sit to/from Stand Sit to Stand: Min guard           General transfer comment: assist for balance      Balance Overall balance assessment: Needs assistance Sitting-balance support: Feet supported Sitting balance-Leahy Scale: Good     Standing balance support: Single extremity supported Standing balance-Leahy Scale: Fair Standing balance comment: static balance without UE support, but needs walker for safest ambulation                           ADL either performed or assessed with clinical judgement   ADL Overall ADL's : Needs assistance/impaired Eating/Feeding: Minimal assistance;Sitting   Grooming: Minimal assistance;Cueing for sequencing;Standing   Upper Body Bathing: Minimal assistance;Sitting   Lower Body Bathing: Moderate assistance;Sitting/lateral leans;Sit to/from stand   Upper Body Dressing : Minimal assistance;Sitting   Lower Body Dressing: Moderate assistance;Sitting/lateral leans;Sit to/from stand   Toilet Transfer: Minimal assistance;Ambulation   Toileting- Clothing Manipulation and Hygiene: Moderate assistance;Sitting/lateral lean;Sit to/from stand       Functional mobility during ADLs: Minimal assistance;Rolling walker (2 wheels) General ADL Comments: Pt requiring multimodal cues for sequencing and initiating.     Vision Baseline Vision/History: 0 No visual deficits Ability to See in Adequate Light: 0 Adequate Patient Visual Report: No change from baseline Vision Assessment?: Vision impaired- to be further tested in functional context Additional Comments: Pt running into  items on his L side with ambulation, unable to assess due to poor command  following/cognition     Perception     Praxis      Pertinent Vitals/Pain Pain Assessment: No/denies pain     Hand Dominance Right   Extremity/Trunk Assessment Upper Extremity Assessment Upper Extremity Assessment: Generalized weakness   Lower Extremity Assessment Lower Extremity Assessment: Defer to PT evaluation   Cervical / Trunk Assessment Cervical / Trunk Assessment: Normal   Communication Communication Communication: Expressive difficulties   Cognition Arousal/Alertness: Awake/alert Behavior During Therapy: WFL for tasks assessed/performed Overall Cognitive Status: Impaired/Different from baseline Area of Impairment: Orientation;Memory;Following commands;Safety/judgement;Problem solving                 Orientation Level: Disoriented to;Place   Memory: Decreased short-term memory Following Commands: Follows one step commands with increased time Safety/Judgement: Decreased awareness of deficits;Decreased awareness of safety   Problem Solving: Slow processing;Requires verbal cues General Comments: Pt with poor recall of events earlier in the day, required multimodal cues to sit up EOB, pt did not remember to wipe himself after toileting, requiring max verbal cues throughout.     General Comments  VSS on RA    Exercises     Shoulder Instructions      Home Living Family/patient expects to be discharged to:: Shelter/Homeless                                 Additional Comments: May go live with daughter, unsure of her home set up.      Prior Functioning/Environment Prior Level of Function : Independent/Modified Independent             Mobility Comments: used cane PTA, multiple falls with seizures ADLs Comments: Indep        OT Problem List: Decreased strength;Decreased activity tolerance;Impaired balance (sitting and/or standing);Impaired vision/perception;Decreased coordination;Decreased cognition;Decreased safety  awareness;Decreased knowledge of use of DME or AE      OT Treatment/Interventions: Self-care/ADL training;Therapeutic exercise;Energy conservation;DME and/or AE instruction;Therapeutic activities;Patient/family education;Balance training    OT Goals(Current goals can be found in the care plan section) Acute Rehab OT Goals Patient Stated Goal: None stated OT Goal Formulation: With patient Time For Goal Achievement: 05/26/21 Potential to Achieve Goals: Good ADL Goals Additional ADL Goal #1: Pt will follow 2 step directions independently 75% of session Additional ADL Goal #2: Pt will complete medication management task with no errors Additional ADL Goal #3: Pt will complete a multistep pathfinding task with min assist.  OT Frequency: Min 2X/week   Barriers to D/C:    Pt is homeless       Co-evaluation              AM-PAC OT "6 Clicks" Daily Activity     Outcome Measure Help from another person eating meals?: A Little Help from another person taking care of personal grooming?: A Little Help from another person toileting, which includes using toliet, bedpan, or urinal?: A Lot Help from another person bathing (including washing, rinsing, drying)?: A Lot Help from another person to put on and taking off regular upper body clothing?: A Little Help from another person to put on and taking off regular lower body clothing?: A Lot 6 Click Score: 15   End of Session Equipment Utilized During Treatment: Gait belt;Rolling walker (2 wheels) Nurse Communication: Mobility status  Activity Tolerance: Patient tolerated treatment well Patient left: in chair;with call bell/phone within  reach;with chair alarm set  OT Visit Diagnosis: Unsteadiness on feet (R26.81);Other abnormalities of gait and mobility (R26.89);Muscle weakness (generalized) (M62.81)                Time: 6759-1638 OT Time Calculation (min): 28 min Charges:  OT General Charges $OT Visit: 1 Visit OT Evaluation $OT Eval  Moderate Complexity: 1 Mod OT Treatments $Self Care/Home Management : 8-22 mins  Vasilia Dise H., OTR/L Acute Rehabilitation  Ammiel Guiney Elane Candiace West 05/12/2021, 3:52 PM

## 2021-05-13 ENCOUNTER — Other Ambulatory Visit: Payer: Self-pay

## 2021-05-13 ENCOUNTER — Other Ambulatory Visit (HOSPITAL_COMMUNITY): Payer: Self-pay

## 2021-05-13 LAB — CBC WITH DIFFERENTIAL/PLATELET
Abs Immature Granulocytes: 0.03 10*3/uL (ref 0.00–0.07)
Basophils Absolute: 0 10*3/uL (ref 0.0–0.1)
Basophils Relative: 0 %
Eosinophils Absolute: 0.1 10*3/uL (ref 0.0–0.5)
Eosinophils Relative: 2 %
HCT: 41.2 % (ref 39.0–52.0)
Hemoglobin: 14.2 g/dL (ref 13.0–17.0)
Immature Granulocytes: 0 %
Lymphocytes Relative: 39 %
Lymphs Abs: 3.1 10*3/uL (ref 0.7–4.0)
MCH: 29.6 pg (ref 26.0–34.0)
MCHC: 34.5 g/dL (ref 30.0–36.0)
MCV: 85.8 fL (ref 80.0–100.0)
Monocytes Absolute: 1.1 10*3/uL — ABNORMAL HIGH (ref 0.1–1.0)
Monocytes Relative: 15 %
Neutro Abs: 3.4 10*3/uL (ref 1.7–7.7)
Neutrophils Relative %: 44 %
Platelets: 239 10*3/uL (ref 150–400)
RBC: 4.8 MIL/uL (ref 4.22–5.81)
RDW: 12.4 % (ref 11.5–15.5)
WBC: 7.9 10*3/uL (ref 4.0–10.5)
nRBC: 0 % (ref 0.0–0.2)

## 2021-05-13 LAB — BASIC METABOLIC PANEL
Anion gap: 9 (ref 5–15)
BUN: 20 mg/dL (ref 6–20)
CO2: 24 mmol/L (ref 22–32)
Calcium: 9.4 mg/dL (ref 8.9–10.3)
Chloride: 104 mmol/L (ref 98–111)
Creatinine, Ser: 1.07 mg/dL (ref 0.61–1.24)
GFR, Estimated: >60 mL/min (ref >60)
Glucose, Bld: 86 mg/dL (ref 70–99)
Potassium: 3.8 mmol/L (ref 3.5–5.1)
Sodium: 137 mmol/L (ref 135–145)

## 2021-05-13 LAB — RPR: RPR Ser Ql: NONREACTIVE

## 2021-05-13 LAB — GLUCOSE, CAPILLARY: Glucose-Capillary: 149 mg/dL — ABNORMAL HIGH (ref 70–99)

## 2021-05-13 LAB — AMMONIA: Ammonia: 16 umol/L (ref 9–35)

## 2021-05-13 MED ORDER — THIAMINE HCL 100 MG PO TABS: 100.0000 mg | ORAL_TABLET | Freq: Every day | ORAL | 2 refills | Status: DC

## 2021-05-13 MED ORDER — THIAMINE HCL 100 MG/ML IJ SOLN
250.0000 mg | Freq: Every day | INTRAVENOUS | Status: DC
  Filled 2021-05-13: qty 2.5

## 2021-05-13 MED ORDER — THIAMINE HCL 100 MG PO TABS
100.0000 mg | ORAL_TABLET | Freq: Every day | ORAL | 2 refills | Status: AC
  Filled 2021-05-13 – 2021-08-07 (×3): qty 30, 30d supply, fill #0
  Filled 2021-08-08: qty 60, 60d supply, fill #0

## 2021-05-13 MED ORDER — THIAMINE HCL 100 MG/ML IJ SOLN
500.0000 mg | Freq: Three times a day (TID) | INTRAVENOUS | Status: AC
  Administered 2021-05-13 – 2021-05-14 (×3): 500 mg via INTRAVENOUS
  Filled 2021-05-13 (×3): qty 5

## 2021-05-13 MED ORDER — THIAMINE HCL 100 MG/ML IJ SOLN: 100.0000 mg | Freq: Every day | INTRAMUSCULAR | Status: DC

## 2021-05-13 MED ORDER — DIVALPROEX SODIUM 500 MG PO DR TAB
500.0000 mg | DELAYED_RELEASE_TABLET | Freq: Two times a day (BID) | ORAL | 2 refills | Status: DC
  Filled 2021-05-13 – 2021-06-30 (×2): qty 60, 30d supply, fill #0

## 2021-05-13 NOTE — Consult Note (Addendum)
Reason for Consult: AMS, Family report that patient is different person Referring Physician: Terrilee Croak, MD     Assessment/Plan: Arthur [name] is a 50 y.o. male admitted medically for 05/06/2021 10:20 AM for Seizures.  He carries the self-reported psychiatric diagnoses of Depression and Anxiety and has a past medical history of  Stroke and Seizures.  Psychiatry was consulted for AMS, Family report that patient is different person.   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  Outpatient psychotropic medications include potentially Wellbutrin, Depakote, and Prolixin and historically has had an unknown response to these medications.  He was not compliant with medications prior to admission as evidenced by his admission he has not taken his medications in about a year.  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  We will not recommend any antipsychotics at this time.   Recommendations: -Outpatient referral for follow up in one month   Continue rest of care per Primary Team These recommendations have been discussed with the Primary Team Psychiatry will sign off at this time, please reconsult if any questions arise    Arthur [name] is an 50 y.o. male.  HPI: Arthur [name] is a 50 y.o. male with medical history significant of stroke, possibly seizures.  Pt homeless.  Found down by EMS laying in parking lot in rain.  BGL 59.  Denies EtOH or substance abuse other than smoking.  Patient states he had a stroke in the past.  He said he has had unresponsive episodes but never been diagnosed with seizures.  3 ED visits in 3 days with seizures during last 2.  Tried to start him on depakote but never picked this med up.   Having apparent seizures at times in ED.    On interview today patient reports that he is doing okay.  He reports that he slept okay last night.  He reports that his appetite is doing good.  He reports no SI, HI, or AVH.  Tried to further clarify today's past psychiatric history since it has been reported that he has a history of schizophrenia.  Asked him if he had any substance abuse history as yesterday he had reported none.  Today he reports significant history of meth use when asked for how long he was unable to give an answer.  He also reports that he occasionally use other things but it was mostly meth.  He reports his last use was about a month ago.  Asked him about hallucinations and tried to determine when in relation to his meth use they were happening.  When asked if the hallucinations were happening while he was using meth or not using meth he replied with yes.  Try to clarify by asking first he had hallucinations while he was using meth and he answered yes then asked if he had hallucinations when he was not using meth and he answered yes.  Despite further rephrasing of the question was unable to get a definitive answer.  He does report that he has not had hallucinations for at least 1 year but has used meth since then.  Discussed that given the risks of starting an antipsychotic when they are not needed is significant we will not recommend starting one now.  We discussed that we would  recommend he have a follow-up appointment with outpatient psych in about 1 month to follow-up in case he does have psychiatric issues.  He reported agreement with this plan and had no concerns about it.  He reports no other concerns at present.  Past Medical History:  Diagnosis Date   Homeless    Stroke [facility]     History reviewed. No pertinent surgical history.  Family History  Problem Relation Age of Onset   Seizures Neg Hx     Social History:  reports that he has been smoking cigarettes. He does not  have any smokeless tobacco history on file. He reports that he does not currently use alcohol. He reports that he does not currently use drugs.  Allergies: No Known Allergies  Medications: I have reviewed the patient's current medications. Prior to Admission:  Medications Prior to Admission  Medication Sig Dispense Refill Last Dose   divalproex (DEPAKOTE) 500 MG DR tablet Take 1 tablet (500 mg total) by mouth 2 (two) times daily. (Patient not taking: Reported on 05/06/2021) 60 tablet 2 Unknown   Scheduled:  amLODipine  10 mg Oral Daily   aspirin EC  81 mg Oral Daily   atorvastatin  10 mg Oral Daily   chlorhexidine gluconate (MEDLINE KIT)  15 mL Mouth Rinse BID   divalproex  500 mg Oral BID   enoxaparin (LOVENOX) injection  40 mg Subcutaneous Q24H   feeding supplement  237 mL Oral TID BM   hydrALAZINE  50 mg Oral Q8H   multivitamin with minerals  1 tablet Oral Daily   propranolol  20 mg Oral BID   [START ON 05/20/2021] thiamine injection  100 mg Intravenous Daily   Continuous:  thiamine injection     Followed by   Derrill Memo ON 05/14/2021] thiamine injection     EAV:[id number] **OR** acetaminophen, dextrose, labetalol, ondansetron **OR** ondansetron (ZOFRAN) IV Anti-infectives (From admission, onward)    None       Results for orders placed or performed during the hospital encounter of 05/06/21 (from the past 48 hour(s))  Vitamin B1     Status: None   Collection Time: 05/11/21  1:56 PM  Result Value Ref Range   Vitamin B1 (Thiamine) 92.0 66.5 - 200.0 nmol/L    Comment: (NOTE) This test was developed and its performance characteristics determined by [facility]. It has not been cleared or approved by the Food and Drug Administration. Performed At: [facility] [place], [place] [id number] Rush Farmer MD HQ:[id number]   Valproic acid level     Status: Abnormal   Collection Time: 05/12/21  2:47 AM  Result Value Ref Range   Valproic Acid Lvl 38 (L)  50.0 - 100.0 ug/mL    Comment: Performed at [facility], [place] [address], [place], [place] [address]  Basic metabolic panel     Status: Abnormal   Collection Time: 05/12/21  2:47 AM  Result Value Ref Range   Sodium 137 135 - 145 mmol/L   Potassium 4.1 3.5 - 5.1 mmol/L   Chloride 105 98 - 111 mmol/L   CO2 22 22 - 32 mmol/L   Glucose, Bld 88 70 - 99 mg/dL    Comment: Glucose reference range applies only to samples taken after fasting for at least 8 hours.   BUN 26 (H) 6 - 20 mg/dL   Creatinine, Ser 1.02 0.61 - 1.24 mg/dL   Calcium 9.3 8.9 - 10.3 mg/dL   GFR, Estimated >60 >60 mL/min  Comment: (NOTE) Calculated using the CKD-EPI Creatinine Equation (2021)    Anion gap 10 5 - 15    Comment: Performed at [facility], [place] [address], [place], [place] [address]  Ammonia     Status: None   Collection Time: 05/13/21  2:15 AM  Result Value Ref Range   Ammonia 16 9 - 35 umol/L    Comment: Performed at [facility], [place] [address], [place], [place] [address]  Basic metabolic panel     Status: None   Collection Time: 05/13/21  2:15 AM  Result Value Ref Range   Sodium 137 135 - 145 mmol/L   Potassium 3.8 3.5 - 5.1 mmol/L   Chloride 104 98 - 111 mmol/L   CO2 24 22 - 32 mmol/L   Glucose, Bld 86 70 - 99 mg/dL    Comment: Glucose reference range applies only to samples taken after fasting for at least 8 hours.   BUN 20 6 - 20 mg/dL   Creatinine, Ser 1.07 0.61 - 1.24 mg/dL   Calcium 9.4 8.9 - 10.3 mg/dL   GFR, Estimated >60 >60 mL/min    Comment: (NOTE) Calculated using the CKD-EPI Creatinine Equation (2021)    Anion gap 9 5 - 15    Comment: Performed at [facility] [address], [place], [place] [address]  CBC with Differential/Platelet     Status: Abnormal   Collection Time: 05/13/21  2:15 AM  Result Value Ref Range   WBC 7.9 4.0 - 10.5 K/uL   RBC 4.80 4.22 - 5.81 MIL/uL   Hemoglobin 14.2 13.0 - 17.0 g/dL   HCT 41.2 39.0 - 52.0 %   MCV 85.8 80.0 - 100.0  fL   MCH 29.6 26.0 - 34.0 pg   MCHC 34.5 30.0 - 36.0 g/dL   RDW 12.4 11.5 - 15.5 %   Platelets 239 150 - 400 K/uL   nRBC 0.0 0.0 - 0.2 %   Neutrophils Relative % 44 %   Neutro Abs 3.4 1.7 - 7.7 K/uL   Lymphocytes Relative 39 %   Lymphs Abs 3.1 0.7 - 4.0 K/uL   Monocytes Relative 15 %   Monocytes Absolute 1.1 (H) 0.1 - 1.0 K/uL   Eosinophils Relative 2 %   Eosinophils Absolute 0.1 0.0 - 0.5 K/uL   Basophils Relative 0 %   Basophils Absolute 0.0 0.0 - 0.1 K/uL   Immature Granulocytes 0 %   Abs Immature Granulocytes 0.03 0.00 - 0.07 K/uL    Comment: Performed at [facility] [address] [place], [place] [address]    No results found.   Blood pressure (!) 121/58, pulse 75, temperature 98.5 F (36.9 C), temperature source Oral, resp. rate 16, height _0  (1.93 m), SpO2 96 %. Psychiatric Specialty Exam: Physical Exam Constitutional:      General: He is not in acute distress.    Appearance: Normal appearance. He is normal weight. He is not ill-appearing or toxic-appearing.  HENT:     Head: Normocephalic.  Pulmonary:     Effort: Pulmonary effort is normal.  Musculoskeletal:        General: Normal range of motion.  Neurological:     General: No focal deficit present.     Mental Status: He is alert.    Review of Systems  Respiratory:  Negative for cough and shortness of breath.   Cardiovascular:  Negative for chest pain.  Gastrointestinal:  Negative for abdominal pain, constipation,  diarrhea, nausea and vomiting.  Neurological:  Negative for weakness and headaches.  Psychiatric/Behavioral:  Negative for agitation, hallucinations, sleep disturbance and suicidal ideas.    Blood pressure (!) 121/58, pulse 75, temperature 98.5 F (36.9 C), temperature source Oral, resp. rate 16, height _0  (1.93 m), SpO2 96 %.Body mass index is 24.34 kg/m.  General Appearance: Casual, Disheveled, and has injuries to left orbit and both hands, multiple tattoos on both arms  Eye  Contact:  Fair  Speech:  Normal Rate and half clear and coherent speech and half slurred  Volume:  Normal  Mood:   "ok"  Affect:  Congruent  Thought Process:  appears coherent as he was able to appropriately answer yes/no questions  Orientation:  Other:  Oriented to person, place, and year not month  Thought Content:  Logical  Suicidal Thoughts:  No  Homicidal Thoughts:  No  Memory:  Immediate;   Fair Recent;   Poor  Judgement:  Intact  Insight:  Present  Psychomotor Activity:  Restlessness and fidgeting  Concentration:  Concentration: Fair and Attention Span: Fair  Recall:  Poor  Fund of Knowledge:  Poor  Language:  Fair  Akathisia:  Negative  Handed:  Right  AIMS (if indicated):     Assets:  Resilience  ADL's:  Impaired  Cognition:  WNL  Sleep:   fair    Briant Cedar 05/13/2021, 11:19 AM

## 2021-05-13 NOTE — Progress Notes (Signed)
PROGRESS NOTE  Arthur [name]  DOB: 06-08-1970  PCP: Kathyrn Lass TSV:[id number]  DOA: 05/06/2021  LOS: 7 days  Hospital Day: 9  Chief Complaint  Patient presents with   Hypoglycemia   Hypertension    Brief narrative: Arthur [name] is a 50 y.o. male with PMH significant for stroke, homelessness, and possibly seizure disorder who has had 3 visits to the emergency department in 3 days with seizure activity. Subsequently hospitalized for further management.   Seen by neurology and psychiatry. See below for details.   Subjective: Patient was seen and examined this morning.  Lying on bed.  Not in distress.  No new symptoms.  Not agitated or restless today. Work-up sent per psychiatry to look for other causes of altered mental status and personality changes  Assessment/Plan: Acute metabolic encephalopathy Seizure disorder -Presented with 3 days of seizure activity  -MRI brain was negative for acute findings but showed chronic infarcts.   -Neurology consultation was obtained.  Patient was maintained on continuous EEG.   -Patient was previously supposed to be on Depakote but was noncompliant to it. It was initially held because of mental status change and subsequently resumed.    Acute metabolic encephalopathy -Patient had episodes of agitation in the hospital.  Per discussion with her daughter, it was noted that patient currently has a different personality than his baseline.  Neurology and psychiatry consultation was obtained. -Thiamine level with sent.  It may take long time to result.  Replacement started. -HIV negative on admission.  Ammonia level normal.  RPR sent this morning.   History of stroke -Chronic infarcts noted in MRI.  Started on aspirin and statin.   Essential hypertension -Blood pressure currently controlled on propanolol, amlodipine and hydralazine.     Hyperthyroidism -Patient without any overt signs or symptoms of hyperthyroidism.  Does not have any thyroid  enlargement on examination.   -TSH noted to be suppressed to less than 0.01.  Free T4 noted to be elevated at 2.74.  Free T3 level was also noted to be elevated at 4.6.thyroid-stimulating immunoglobulin level is pending.   -Because of homelessness and medical noncompliance, patient was not started on antithyroid medications.  He has however been initiated on propanolol.  Patient does not seem to have any symptoms per se related to hyperthyroidism. Recent Labs    05/08/21 0222  TSH <0.010*    Minimally displaced fracture of the left nasal bone -This is secondary to fall he sustained after seizure several days ago.  He also has soft tissue contusion of the left cheek and chin.  Outpatient follow-up with ENT/Plastics as needed.   -No need for any intervention based on the previous hospitalist's discussion with Dr. Marcelline Deist with ENT   AKI  Acute metabolic acidosis -Creatinine was elevated to 1.31, improved with hydration.  Serum bicarb level improved as well. Recent Labs    05/03/21 0922 05/04/21 1448 05/06/21 1100 05/08/21 0222 05/09/21 0254 05/10/21 0133 05/12/21 0247 05/13/21 0215  BUN 24* 25* 23* 6 11 10  26* 20  CREATININE 1.31* 1.25* 1.06 0.91 0.97 0.84 1.02 1.07  CO2 17* 18* 15* 24 20* 20* 22 24    Hypokalemia/hypomagnesemia -Due to poor oral intake.  Improved with replacement. Recent Labs  Lab 05/08/21 0222 05/09/21 0254 05/10/21 0133 05/12/21 0247 05/13/21 0215  K 2.8* 3.4* 3.3* 4.1 3.8  MG 1.6* 1.9  --   --   --    Mobility: Encourage ambulation Living condition: Homeless prior to admission Goals of care:  Code Status: Full Code  Nutritional status: Body mass index is 24.34 kg/m.  Nutrition Problem: Severe Malnutrition Etiology: social / environmental circumstances Signs/Symptoms: severe muscle depletion, severe fat depletion Diet:  Diet Order             Diet - low sodium heart healthy           Diet Heart Room service appropriate? Yes; Fluid  consistency: Thin  Diet effective now                  DVT prophylaxis:  enoxaparin (LOVENOX) injection 40 mg Start: 05/06/21 2200   Antimicrobials: None Fluid: None Consultants: Neurology, psychiatry Family Communication: Daughter not at bedside.  Status is: Inpatient  Continue in-hospital care because: Encephalopathy work-up in progress.   Level of care: Telemetry Medical   Dispo: The patient is from: Homeless              Anticipated d/c is to: Tentative plan to discharge to home with daughter tomorrow              Patient currently is not medically stable to d/c.   Difficult to place patient No     Infusions:   thiamine injection      Scheduled Meds:  amLODipine  10 mg Oral Daily   aspirin EC  81 mg Oral Daily   atorvastatin  10 mg Oral Daily   chlorhexidine gluconate (MEDLINE KIT)  15 mL Mouth Rinse BID   divalproex  500 mg Oral BID   enoxaparin (LOVENOX) injection  40 mg Subcutaneous Q24H   feeding supplement  237 mL Oral TID BM   hydrALAZINE  50 mg Oral Q8H   multivitamin with minerals  1 tablet Oral Daily   propranolol  20 mg Oral BID   [START ON 05/20/2021] thiamine injection  100 mg Intravenous Daily    PRN meds: acetaminophen **OR** acetaminophen, dextrose, labetalol, ondansetron **OR** ondansetron (ZOFRAN) IV   Antimicrobials: Anti-infectives (From admission, onward)    None       Objective: Vitals:   05/13/21 0816 05/14/21 0504  BP: (!) 121/58 135/87  Pulse: 75 63  Resp: 16 18  Temp: 98.5 F (36.9 C) 98.3 F (36.8 C)  SpO2: 96% 99%    Intake/Output Summary (Last 24 hours) at 05/14/2021 0713 Last data filed at 05/14/2021 0500 Gross per 24 hour  Intake 725.14 ml  Output 1400 ml  Net -674.86 ml   There were no vitals filed for this visit. Weight change:  Body mass index is 24.34 kg/m.   Physical Exam: General exam: Pleasant, middle-aged Caucasian male.  Not in physical distress Skin: No rashes, lesions or ulcers.  Dried up  scabs in face and knuckles HEENT: Atraumatic, normocephalic, no obvious bleeding Lungs: Clear to auscultation bilaterally CVS: Regular rate and rhythm, no murmur GI/Abd soft, nontender, nondistended, bowel sound present CNS: Alert, awake, oriented x3 Psychiatry: Mood appropriate Extremities: No pedal edema, no calf tenderness  Data Review: I have personally reviewed the laboratory data and studies available.  F/u labs ordered Unresulted Labs (From admission, onward)    None       Signed, Terrilee Croak, MD [facility] 05/14/2021

## 2021-05-13 NOTE — Progress Notes (Signed)
Physical Therapy Treatment Patient Details Name: Arthur [name] MRN: [id number] DOB: [birth year] Today's Date: 05/13/2021   History of Present Illness Patient is a 50 y/o male admitted after three visits to ED in 3 days due to seizures.  PMH positive for CVA and seizure disorder, but not on meds.  Patient is homeless and found to have acute metabolic encephalopathy, high blood pressure, hypokalemia, hypomagnesemia and min displaced L nasal bone fracture.    PT Comments    Patient progressing this session with improved midline positioning in hallway with cues and able to work on coordination for R LE at sink in room.  He reported fatigue with ambulation and noted sleeping in some this am.  Encouraged to allow for time to rest as still healing from seizures and will need to have rest breaks.  Patient remains appropriate for home with assistance with outpatient PT follow up.  PT will continue to follow until d/c.    Recommendations for follow up therapy are one component of a multi-disciplinary discharge planning process, led by the attending physician.  Recommendations may be updated based on patient status, additional functional criteria and insurance authorization.  Follow Up Recommendations  Outpatient PT     Assistance Recommended at Discharge Frequent or constant Supervision/Assistance  Equipment Recommendations  Rolling walker (2 wheels)    Recommendations for Other Services       Precautions / Restrictions Precautions Precautions: Fall Precaution Comments: seizure     Mobility  Bed Mobility Overal bed mobility: Needs Assistance Bed Mobility: Supine to Sit     Supine to sit: Supervision;HOB elevated     General bed mobility comments: increased time and wrapped up in sheet, but sat up on his own    Transfers Overall transfer level: Needs assistance Equipment used: None;Rolling walker (2 wheels) Transfers: Sit to/from Stand Sit to Stand: Min guard;Supervision            General transfer comment: assist for balance, initially no device due to getting sheet out from under him, then with RW and S    Ambulation/Gait Ambulation/Gait assistance: Supervision;Min guard Gait Distance (Feet): 200 Feet Assistive device: Rolling walker (2 wheels) Gait Pattern/deviations: Step-through pattern;Decreased stride length;Drifts right/left;Ataxic     Pre-gait activities: at sink in room patient stepping forward with cane between feet for improved R forward progression, then stepping over cane and backwards over cane with sink for support and min A HHA cues for R heel strike and sequencing. General Gait Details: noted R foot moving more to L at times, cues for midline position in hallway.  Performed obstacle negotiation around 3 squares in flooring with mod cues and S to minguard A.   Stairs             Wheelchair Mobility    Modified Rankin (Stroke Patients Only)       Balance Overall balance assessment: Needs assistance   Sitting balance-Leahy Scale: Good       Standing balance-Leahy Scale: Fair                              Cognition Arousal/Alertness: Awake/alert Behavior During Therapy: WFL for tasks assessed/performed Overall Cognitive Status: Impaired/Different from baseline Area of Impairment: Safety/judgement;Problem solving;Memory;Attention                   Current Attention Level: Sustained Memory: Decreased short-term memory   Safety/Judgement: Decreased awareness of deficits;Decreased awareness of safety   Problem  Solving: Slow processing;Requires verbal cues          Exercises      General Comments        Pertinent Vitals/Pain Pain Assessment: No/denies pain    Home Living                          Prior Function            PT Goals (current goals can now be found in the care plan section) Progress towards PT goals: Progressing toward goals    Frequency    Min  3X/week      PT Plan Current plan remains appropriate    Co-evaluation              AM-PAC PT "6 Clicks" Mobility   Outcome Measure  Help needed turning from your back to your side while in a flat bed without using bedrails?: None Help needed moving from lying on your back to sitting on the side of a flat bed without using bedrails?: None Help needed moving to and from a bed to a chair (including a wheelchair)?: A Little Help needed standing up from a chair using your arms (e.g., wheelchair or bedside chair)?: A Little Help needed to walk in hospital room?: A Little Help needed climbing 3-5 steps with a railing? : A Little 6 Click Score: 20    End of Session Equipment Utilized During Treatment: Gait belt Activity Tolerance: Patient tolerated treatment well Patient left: in chair;with call bell/phone within reach;with chair alarm set   PT Visit Diagnosis: Other abnormalities of gait and mobility (R26.89);Muscle weakness (generalized) (M62.81);Other symptoms and signs involving the nervous system (R29.898)     Time: 1610-9604 PT Time Calculation (min) (ACUTE ONLY): 18 min  Charges:  $Gait Training: 8-22 mins                    Sheran Lawless, PT [facility] Services Pager:[phone] [phone] 05/13/2021    Arthur [name] 05/13/2021, 1:30 PM

## 2021-05-13 NOTE — Progress Notes (Signed)
Neurology Progress Note  S: Patient states he feels well. No HA, vision changes, eye pain, weakness, numbness, tingling, hallucinations. Says he is just tired. Tells NP that he last used Methamphetamines on the day of admission. Denies use of Cocaine or Heroin.   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    Per psychiatry note: "Daughter confirms that the patient had been moving from [place] to live with her and her significant other.  She states that she last saw him in July in [place].  She states that he seemed normal and so much that he was able to hug her and was coherent it is in thinking.  She states that over the last few weeks she noticed his speech quality was going down.  She states that when they had a video chat on December 1 she asked him what was wrong and he could not answer.  She states that by December 3 when he got on the bus in [place] he was significantly worse.  She says the last contact she had from him was on December 4 when he texted her to say his phone had to present battery.   When asked about his history she states that she has not really had a significant amount of interactions with him.  She states she is only ever physically seen him 4 times the first of which being when she was 13 when he first establish contact with her.  She states that they have been talking on the phone regularly for a little while now.  She states that to her knowledge his past psychiatric history consists of schizophrenia, bipolar disorder, and depression.  She reports that she has never seen the patient in a psychotic state.  She states that to her knowledge he has never been psychiatrically hospitalized.  She then asked what  the patient and said and stated that since this was a long-term memory he was probably correct and what he told me since his long-term memory tends to be okay."  O: Current vital signs: BP 135/80 (BP Location: Left Arm)    Pulse 67    Temp 98.2 F (36.8 C) (Oral)    Resp 17    SpO2 96%  Vital signs in last 24 hours: Temp:  [97.9 F (36.6 C)-98.2 F (36.8 C)] 98.2 F (36.8 C) (12/16 0339) Pulse Rate:  [58-82] 67 (12/16 0339) Resp:  [17-22] 17 (12/16 0339) BP: (105-157)/(67-95) 135/80 (12/16 0339) SpO2:  [96 %-100 %] 96 % (12/16 0339)  GENERAL: Fairly well appearing male in NAD.  HEENT: Normocephalic and atraumatic. LUNGS: Normal respiratory effort.  CV: RRR. Ext: warm. Abrasions on knuckles  NEURO:  Mental Status: Easily aroused. Oriented to self, state, city, month and year. Not oriented to hospital (says he is in psych hospital), day, or date. Follows all commands.  Speech/Language: speech is without aphasia or dysarthria.  Naming, repetition, fluency, and comprehension intact. Unable to tell NP how many quarters are in $2.75. States there are 2, but says $1 has 4.  Poor attention concentration.  Cranial Nerves:  PERRL, phonation is normal. Face is symmetrical at rest. Hearing intact to voice. Motor 5/5. Tone is normal. Bulk is normal.  Sensation is intact and symmetrical to face and extremities.   Gait- deferred.  Medications  Current Facility-Administered Medications:    acetaminophen (TYLENOL) tablet 650 mg, 650 mg, Oral, Q4H PRN, 650 mg at 05/12/21 2120 **OR** acetaminophen (TYLENOL) suppository 650 mg, 650 mg, Rectal, Q4H PRN, Alcario Drought, Jared M, DO   amLODipine (NORVASC) tablet 10 mg, 10 mg, Oral, Daily, Bonnielee Haff, MD, 10 mg at 05/12/21 1040   aspirin EC tablet 81 mg, 81 mg, Oral, Daily, Bonnielee Haff, MD, 81 mg at 05/12/21 1040   atorvastatin (LIPITOR) tablet 10 mg, 10 mg, Oral, Daily, Bonnielee Haff, MD, 10 mg at 05/12/21 1040   chlorhexidine gluconate (MEDLINE  KIT) (PERIDEX) 0.12 % solution 15 mL, 15 mL, Mouth Rinse, BID, Alcario Drought, Jared M, DO, 15 mL at 05/12/21 2000   dextrose (GLUTOSE) 40 % oral gel 31 g, 1 Tube, Oral, PRN, Hayden Rasmussen, MD, 31 g at 05/06/21 1457   divalproex (DEPAKOTE) DR tablet 500 mg, 500 mg, Oral, BID, Alcario Drought, Jared M, DO, 500 mg at 05/12/21 2120   enoxaparin (LOVENOX) injection 40 mg, 40 mg, Subcutaneous, Q24H, Alcario Drought, Jared M, DO, 40 mg at 05/12/21 2120   feeding supplement (ENSURE ENLIVE / ENSURE PLUS) liquid 237 mL, 237 mL, Oral, TID BM, Bonnielee Haff, MD, 237 mL at 05/12/21 2100   hydrALAZINE (APRESOLINE) tablet 50 mg, 50 mg, Oral, Q8H, Bonnielee Haff, MD, 50 mg at 05/13/21 6440   labetalol (NORMODYNE) injection 10-20 mg, 10-20 mg, Intravenous, Q2H PRN, Etta Quill, DO, 20 mg at 05/10/21 0450   multivitamin with minerals tablet 1 tablet, 1 tablet, Oral, Daily, Bonnielee Haff, MD, 1 tablet at 05/12/21 1040   ondansetron (ZOFRAN) tablet 4 mg, 4 mg, Oral, Q6H PRN **OR** ondansetron (ZOFRAN) injection 4 mg, 4 mg, Intravenous, Q6H PRN, Alcario Drought, Jared M, DO   propranolol (INDERAL) tablet 20 mg, 20 mg, Oral, BID, Bonnielee Haff, MD, 20 mg at 05/12/21 2120   thiamine 545m in normal saline (564m IVPB, 500 mg, Intravenous, Q8H **FOLLOWED BY** [START ON 05/14/2021] thiamine (B-1) 250 mg in sodium chloride 0.9 % 50 mL IVPB, 250 mg, Intravenous, Daily **FOLLOWED BY** [START ON 05/20/2021] thiamine (B-1) injection 100 mg, 100 mg, Intravenous, Daily, Dahal, Binaya, MD  Pertinent Labs Ammonia normal. TSH low, Free T4 high consistent with hyperthyroidism.   Imaging 05/06/21 MRI Brain  Evaluation is somewhat limited by motion artifact. Within this limitation, no acute intracranial process. 05/10/21 EEG This study is suggestive of mild diffuse encephalopathy, nonspecific etiology. No seizures or epileptiform discharges were seen throughout the recording.   Assessment: [age]o male with a hx of polysubstance abuse (meth and  ETOH), homelessness, bipolar, depression, anxiety, and schizophrenia. He presented with decreased responsiveness of unclear etiology. Unfortunately, patient kept pulling off leads for cEEG, but no correlating spells were found on 05/10/21. It is quite possible that patient had prolonged post ictal state due to past old strokes. He is much more awake and participative in exam that when NP saw him a few days ago. His psychiatric state is likely playing a large role in his post LOC behavior. He has been started on Thiamine repletion given concern for Wernicke's.   Impression: -History of seizure disorder with non adherence to Depakote.  -Multiple psychiatry diagnoses with history of substance abuse and poor brain reserve  Recommendations/Plan:  -Continue Depakote 50029mo bid.  -Seizure precautions.  -Continue Thiamine supplementation. -Would recommend discharge team/CM involvement for discharge plan of care.  -Follow psychiatry recs - appreciate consult. -Outpatient neurology follow up in 4-6 weeks  Neurology service inpatient will be available PRN  Pt seen by Clance Boll, MSN, APN-BC/Nurse Practitioner/Neuro and later by MD. Note and plan to be edited as needed by MD.  Pager: [phone]  Attending Neurohospitalist Addendum Patient seen and examined with APP/Resident. Agree with the history and physical as documented above. Agree with the plan as documented, which I helped formulate. I have independently reviewed the chart, obtained history, review of systems and examined the patient.I have personally reviewed pertinent head/neck/spine imaging (CT/MRI). Plan d/w psychiatry team resident doctor. Please feel free to call with any questions.  -- Amie Portland, MD Neurologist [facility] Pager: [phone]

## 2021-05-13 NOTE — Care Management Important Message (Signed)
Important Message  Patient Details  Name: Arthur [name] MRN: [id number] Date of Birth: Apr 26, 1971   Medicare Important Message Given:  Yes     Renie Ora 05/13/2021, 12:13 PM

## 2021-05-14 LAB — GLUCOSE, CAPILLARY
Glucose-Capillary: 90 mg/dL (ref 70–99)
Glucose-Capillary: 115 mg/dL — ABNORMAL HIGH (ref 70–99)

## 2021-05-14 NOTE — Discharge Summary (Signed)
Physician Discharge Summary  Arthur [name] ZOX:[id number] DOB: 23-Dec-1970 DOA: 05/06/2021  PCP: Pcp, No  Admit date: 05/06/2021 Discharge date: 05/14/2021  Admitted From: Homeless Discharge disposition: Discharge home with daughter   Code Status: Full Code  Diet Recommendation: Cardiac diet  Discharge Diagnosis:   Principal Problem:   Seizures (HCC) Active Problems:   HTN (hypertension)   Seizure (HCC)   Malnutrition of moderate degree   History of Present Illness / Brief narrative:  Patient is a 50 year old with homelessness Caucasian male with a past medical history of stroke and possibly seizure disorder who has had 3 visits to the emergency department in 3 days with seizure activity. Subsequently hospitalized for further management.    Subjective:  Seen and examined this morning.  Middle-aged Caucasian male.  Looks older for his age.  Not in distress.  Lying down in bed.  Hospital Course:  Seizure disorder -Presented with 3 days of seizure activity  -MRI brain was negative for acute findings but showed chronic infarcts.   -Neurology consultation was obtained.  Patient was maintained on continuous EEG.   -Patient was previously supposed to be on Depakote but was noncompliant to it. It was initially held because of mental status change and subsequently resumed.     Acute metabolic encephalopathy -Patient had episodes of agitation in the hospital.  Per discussion with her daughter, it was noted that patient currently has a different personality than his baseline.  Neurology and psychiatry consultation was obtained. -Thiamine level with sent.  It may take long time to result.  Replacement started. -HIV negative on admission.  Ammonia level normal.  RPR sent this morning.     History of stroke -Chronic infarcts noted in MRI.  Started on aspirin and statin.   Essential hypertension -Blood pressure currently controlled on propanolol, amlodipine and hydralazine.     Hyperthyroidism -Patient without any overt signs or symptoms of hyperthyroidism.  Does not have any thyroid enlargement on examination.   -TSH noted to be suppressed to less than 0.01.  Free T4 noted to be elevated at 2.74.  Free T3 level also noted to be elevated at 4.6.thyroid-stimulating immunoglobulin level is pending.   -Because of homelessness and medical noncompliance, patient was not started on antithyroid medications.  He has however been initiated on propanolol.  Patient does not seem to have any symptoms per se related to hyperthyroidism.  Minimally displaced fracture of the left nasal bone -This is secondary to fall he sustained after seizure several days ago.  He also has soft tissue contusion of the left cheek and chin.  Outpatient follow-up with ENT/Plastics as needed.   -No need for any intervention based on the previous hospitalist's discussion with Dr. Elijah Birk with ENT  [facility]  Acute metabolic acidosis -Creatinine was elevated to 1.31, improved with hydration.  Serum bicarb level improved as well. Recent Labs    05/03/21 0922 05/04/21 1448 05/06/21 1100 05/08/21 0222 05/09/21 0254 05/10/21 0133 05/12/21 0247 05/13/21 0215  BUN 24* 25* 23* 26* 20  CREATININE 1.31* 1.25* 1.06 0.91 0.97 0.84 1.02 1.07  CO2 17* 18* 15* 24 20* 20* 22 24    Hypokalemia/hypomagnesemia -Due to poor oral intake.  Improved with replacement. Recent Labs  Lab 05/08/21 0222 05/09/21 0254 05/10/21 0133 05/12/21 0247 05/13/21 0215  K 2.8* 3.4* 3.3* 4.1 3.8  MG 1.6* 1.9  --   --   --      Discharge Medications:   Allergies as of 05/14/2021  No Known Allergies      Medication List     TAKE these medications    amLODipine 10 MG tablet Commonly known as: NORVASC Take 1 tablet (10 mg total) by mouth daily.   aspirin 81 MG EC tablet Take 1 tablet (81 mg total) by mouth daily. Swallow whole.   atorvastatin 10 MG tablet Commonly known as: LIPITOR Take 1 tablet (10  mg total) by mouth daily.   divalproex 500 MG DR tablet Commonly known as: Depakote Take 1 tablet (500 mg total) by mouth 2 (two) times daily.   propranolol 20 MG tablet Commonly known as: INDERAL Take 1 tablet (20 mg total) by mouth 2 (two) times daily.   thiamine 100 MG tablet Take 1 tablet (100 mg total) by mouth daily.               Durable Medical Equipment  (From admission, onward)           Start     Ordered   05/12/21 1406  For home use only DME Walker rolling  Once       Question Answer Comment  Walker: With 5 Inch Wheels   Patient needs a walker to treat with the following condition Weakness      05/12/21 1405            Wound care:     Discharge Instructions:   Discharge Instructions     Ambulatory referral to Occupational Therapy   Complete by: As directed    Ambulatory referral to Physical Therapy   Complete by: As directed    Ambulatory referral to Speech Therapy   Complete by: As directed    Call MD for:  difficulty breathing, headache or visual disturbances   Complete by: As directed    Call MD for:  extreme fatigue   Complete by: As directed    Call MD for:  hives   Complete by: As directed    Call MD for:  persistant dizziness or light-headedness   Complete by: As directed    Call MD for:  persistant nausea and vomiting   Complete by: As directed    Call MD for:  severe uncontrolled pain   Complete by: As directed    Call MD for:  temperature >100.4   Complete by: As directed    Diet - low sodium heart healthy   Complete by: As directed    Discharge instructions   Complete by: As directed    General discharge instructions:  Follow with Primary MD Pcp, No in 7 days   Get CBC/BMP checked in next visit within 1 week by PCP or SNF MD. (We routinely change or add medications that can affect your baseline labs and fluid status, therefore we recommend that you get the mentioned basic workup next visit with your PCP, your PCP may  decide not to get them or add new tests based on their clinical decision)  On your next visit with your PCP, please get your medicines reviewed and adjusted.  Please request your PCP  to go over all hospital tests, procedures, radiology results at the follow up, please get all Hospital records sent to your PCP by signing hospital release before you go home.  Activity: As tolerated with Full fall precautions use walker/cane & assistance as needed  Avoid using any recreational substances like cigarette, tobacco, alcohol, or non-prescribed drug.  If you experience worsening of your admission symptoms, develop shortness of breath, life threatening emergency,  suicidal or homicidal thoughts you must seek medical attention immediately by calling 911 or calling your MD immediately  if symptoms less severe.  You must read complete instructions/literature along with all the possible adverse reactions/side effects for all the medicines you take and that have been prescribed to you. Take any new medicine only after you have completely understood and accepted all the possible adverse reactions/side effects.   Do not drive, operate heavy machinery, perform activities at heights, swimming or participation in water activities or provide baby sitting services if your were admitted for syncope or siezures until you have seen by Primary MD or a Neurologist and advised to do so again.  Do not drive when taking Pain medications.  Do not take more than prescribed Pain, Sleep and Anxiety Medications  Wear Seat belts while driving.  Please note You were cared for by a hospitalist during your hospital stay. If you have any questions about your discharge medications or the care you received while you were in the hospital after you are discharged, you can call the unit and asked to speak with the hospitalist on call if the hospitalist that took care of you is not available. Once you are discharged, your primary care  physician will handle any further medical issues. Please note that NO REFILLS for any discharge medications will be authorized once you are discharged, as it is imperative that you return to your primary care physician (or establish a relationship with a primary care physician if you do not have one) for your aftercare needs so that they can reassess your need for medications and monitor your lab values.   Increase activity slowly   Complete by: As directed        Follow ups:    Follow-up Information     [facility]. Follow up.   Why: Transportation services needs 2-3 days notice to schedule a ride Contact information: [phone]        [facility]. Schedule an appointment as soon as possible for a visit in 1 week(s).   Specialty: Rehabilitation Contact information: [address] [id number] [place] [address] [phone]        [facility] Follow up.   Why: The office will contact Samantha to let you know if accepted and to schedule and appointment Contact information: [address] [place] [address] [phone]                Discharge Exam:   Vitals:   05/13/21 0816 05/13/21 0955 05/14/21 0504 05/14/21 0811  BP: (!) 121/58  135/87 125/88  Pulse: 75  63 74  Resp: Temp: 98.5 F (36.9 C)  98.3 F (36.8 C) 98.2 F (36.8 C)  TempSrc: Oral  Oral Oral  SpO2: 96%  99% 97%  Height:   (1.93 m)      Body mass index is 24.34 kg/m.  General exam: Pleasant, middle-aged Caucasian male.  Looks older for his age. Skin: No rashes, lesions or ulcers. HEENT: soft tissue contusion of the left cheek and chin, present on admission Lungs: Clear to auscultation bilaterally CVS: Regular rate and rhythm, no murmur GI/Abd soft, nontender, nondistended, bowel sound present CNS: Alert, awake, oriented x3 Psychiatry: Mood  appropriate Extremities: No pedal edema, no calf tenderness  Time coordinating discharge: 35 minutes   The results of significant diagnostics from this hospitalization (including imaging, microbiology, ancillary and laboratory)  are listed below for reference.    Procedures and Diagnostic Studies:   DG Orbits  Result Date: 05/06/2021 CLINICAL DATA:  Altered mental status.  Clearing for MRI EXAM: ORBITS - COMPLETE 4+ VIEW COMPARISON:  None. FINDINGS: There is no evidence of metallic foreign body within the orbits. No significant bone abnormality identified. IMPRESSION: No evidence of metallic foreign body within the orbits. Electronically Signed   By: Marlan Palau M.D.   On: 05/06/2021 16:06   DG Chest 1 View  Result Date: 05/06/2021 CLINICAL DATA:  Altered mental status. EXAM: CHEST  1 VIEW COMPARISON:  None. FINDINGS: The heart size and mediastinal contours are within normal limits. Both lungs are clear. The visualized skeletal structures are unremarkable. IMPRESSION: No active disease. Electronically Signed   By: Marlan Palau M.D.   On: 05/06/2021 16:04   CT Head Wo Contrast  Result Date: 05/06/2021 CLINICAL DATA:  Mental status changes EXAM: CT HEAD WITHOUT CONTRAST TECHNIQUE: Contiguous axial images were obtained from the base of the skull through the vertex without intravenous contrast. COMPARISON:  05/03/2021 FINDINGS: Brain: No evidence of acute infarction, hemorrhage, hydrocephalus, extra-axial collection or mass lesion/mass effect. Periventricular white matter low attenuation as can be seen with microvascular disease which is greater than expected for the patient's age. Bilateral basal ganglia lacunar infarcts. Small lacunar infarct in the left thalamus. Vascular: No hyperdense vessel or unexpected calcification. Skull: No osseous abnormality. Sinuses/Orbits: Visualized paranasal sinuses are clear. Visualized mastoid sinuses are clear. Visualized orbits demonstrate no focal  abnormality. Other: None IMPRESSION: 1. No acute intracranial pathology. 2. Periventricular white matter low attenuation as can be seen with microvascular disease which is greater than expected for the patient's age. Bilateral basal ganglia lacunar infarcts. Small lacunar infarct in the left thalamus. Electronically Signed   By: Elige Ko M.D.   On: 05/06/2021 14:03   MR BRAIN WO CONTRAST  Result Date: 05/06/2021 CLINICAL DATA:  Altered mental status, confusion EXAM: MRI HEAD WITHOUT CONTRAST TECHNIQUE: Multiplanar, multiecho pulse sequences of the brain and surrounding structures were obtained without intravenous contrast. COMPARISON:  No prior MRI, correlation is made with CT head 05/06/2021 FINDINGS: Evaluation is somewhat limited by motion artifact. Brain: No restricted diffusion to suggest acute or subacute infarction. No acute hemorrhage, mass, mass effect, or midline shift. Multiple lacunar infarcts in the bilateral thalami, basal ganglia, and corona radiata. Confluent T2 hyperintense signal in the periventricular white matter and pons, likely the sequela of severe chronic small vessel ischemic disease. Punctate foci of hemosiderin deposition left frontal lobe, right occipital lobe, pons, right thalamus, and left basal ganglia, likely sequela of prior hypertensive microhemorrhages. Vascular: Normal flow voids. Skull and upper cervical spine: Normal marrow signal. Sinuses/Orbits: Negative. Other: None. IMPRESSION: Evaluation is somewhat limited by motion artifact. Within this limitation, no acute intracranial process. Electronically Signed   By: Wiliam Ke M.D.   On: 05/06/2021 18:07   DG Abd 2 Views  Result Date: 05/06/2021 CLINICAL DATA:  Altered Mental status EXAM: ABDOMEN - 2 VIEW COMPARISON:  None. FINDINGS: The bowel gas pattern is normal. There is no evidence of free air. No radio-opaque calculi or other significant radiographic abnormality is seen. IMPRESSION: Negative. Electronically  Signed   By: Marlan Palau M.D.   On: 05/06/2021 16:09   EEG adult  Result Date: 05/07/2021 Jefferson Fuel, MD     05/07/2021  8:36 PM Routine EEG Report Khing Belcher is a 50 y.o. male with a history of strokes and seizures who  is undergoing an EEG to evaluate for seizures. Report: This EEG was acquired with electrodes placed according to the International 10-20 electrode system (including Fp1, Fp2, F3, F4, C3, C4, P3, P4, O1, O2, T3, T4, T5, T6, A1, A2, Fz, Cz, Pz). The following electrodes were missing or displaced: none. The occipital dominant rhythm was 7-8 Hz. This activity is reactive to stimulation. Drowsiness was manifested by background fragmentation; deeper stages of sleep were identified by K complexes and sleep spindles. There was superimposed bifrontal focal slowing. There were no interictal epileptiform discharges. There were no electrographic seizures identified. Photic stimulation and hyperventilation were not performed. Impression and clinical correlation: This EEG was obtained while awake and asleep and is abnormal due to mild diffuse slowing indicative of global cerebral dysfunction as well as focal bifrontal slowing indicating superimposed focal cerebral dysfunction in these areas. Epileptiform abnormalities were not seen during this recording. Patient will be monitored on cEEG overnight. Bing Neighbors, MD [facility] [phone] If 7pm- 7am, please page neurology on call as listed in AMION.     Labs:   Basic Metabolic Panel: Recent Labs  Lab 05/08/21 0222 05/09/21 0254 05/10/21 0133 05/12/21 0247 05/13/21 0215  NA 137 137 138 137 137  K 2.8* 3.4* 3.3* 4.1 3.8  CL 104 107 110 105 104  CO2 24 20* 20* 22 24  GLUCOSE 97 101* 80 88 86  BUN 6 11 10  26* 20  CREATININE 0.91 0.97 0.84 1.02 1.07  CALCIUM 8.4* 8.9 7.8* 9.3 9.4  MG 1.6* 1.9  --   --   --     GFR Estimated Creatinine Clearance: 102.5 mL/min (by C-G formula based on SCr of 1.07  mg/dL). Liver Function Tests: Recent Labs  Lab 05/08/21 0222  AST 25  ALT 26  ALKPHOS 56  BILITOT 0.8  PROT 5.3*  ALBUMIN 2.9*    No results for input(s): LIPASE, AMYLASE in the last 168 hours. Recent Labs  Lab 05/13/21 0215  AMMONIA 16   Coagulation profile No results for input(s): INR, PROTIME in the last 168 hours.  CBC: Recent Labs  Lab 05/08/21 0222 05/13/21 0215  WBC 6.9 7.9  NEUTROABS  --  3.4  HGB 12.0* 14.2  HCT 34.7* 41.2  MCV 84.6 85.8  PLT 212 239    Cardiac Enzymes: No results for input(s): CKTOTAL, CKMB, CKMBINDEX, TROPONINI in the last 168 hours. BNP: Invalid input(s): POCBNP CBG: Recent Labs  Lab 05/10/21 0906 05/10/21 1220 05/10/21 1713 05/13/21 2132 05/14/21 0810  GLUCAP 86 98 87 149* 90    D-Dimer No results for input(s): DDIMER in the last 72 hours. Hgb A1c No results for input(s): HGBA1C in the last 72 hours. Lipid Profile No results for input(s): CHOL, HDL, LDLCALC, TRIG, CHOLHDL, LDLDIRECT in the last 72 hours. Thyroid function studies No results for input(s): TSH, T4TOTAL, T3FREE, THYROIDAB in the last 72 hours.  Invalid input(s): FREET3  Anemia work up No results for input(s): VITAMINB12, FOLATE, FERRITIN, TIBC, IRON, RETICCTPCT in the last 72 hours.  Microbiology Recent Results (from the past 240 hour(s))  Resp Panel by RT-PCR (Flu A&B, Covid) Nasopharyngeal Swab     Status: None   Collection Time: 05/06/21  5:06 PM   Specimen: Nasopharyngeal Swab; Nasopharyngeal(NP) swabs in vial transport medium  Result Value Ref Range Status   SARS Coronavirus 2 by RT PCR NEGATIVE NEGATIVE Final    Comment: (NOTE) SARS-CoV-2 target nucleic acids are NOT DETECTED.  The SARS-CoV-2 RNA is generally detectable in upper  respiratory specimens during the acute phase of infection. The lowest concentration of SARS-CoV-2 viral copies this assay can detect is 138 copies/mL. A negative result does not preclude SARS-Cov-2 infection and  should not be used as the sole basis for treatment or other patient management decisions. A negative result may occur with  improper specimen collection/handling, submission of specimen other than nasopharyngeal swab, presence of viral mutation(s) within the areas targeted by this assay, and inadequate number of viral copies(<138 copies/mL). A negative result must be combined with clinical observations, patient history, and epidemiological information. The expected result is Negative.  Fact Sheet for Patients:  BloggerCourse.com  Fact Sheet for Healthcare Providers:  SeriousBroker.it  This test is no t yet approved or cleared by the [place] FDA and  has been authorized for detection and/or diagnosis of SARS-CoV-2 by FDA under an Emergency Use Authorization (EUA). This EUA will remain  in effect (meaning this test can be used) for the duration of the COVID-19 declaration under Section 564(b)(1) of the Act, 21 U.S.C.section 360bbb-3(b)(1), unless the authorization is terminated  or revoked sooner.       Influenza A by PCR NEGATIVE NEGATIVE Final   Influenza B by PCR NEGATIVE NEGATIVE Final    Comment: (NOTE) The Xpert Xpress SARS-CoV-2/FLU/RSV plus assay is intended as an aid in the diagnosis of influenza from Nasopharyngeal swab specimens and should not be used as a sole basis for treatment. Nasal washings and aspirates are unacceptable for Xpert Xpress SARS-CoV-2/FLU/RSV testing.  Fact Sheet for Patients: BloggerCourse.com  Fact Sheet for Healthcare Providers: SeriousBroker.it  This test is not yet approved or cleared by the [place] FDA and has been authorized for detection and/or diagnosis of SARS-CoV-2 by FDA under an Emergency Use Authorization (EUA). This EUA will remain in effect (meaning this test can be used) for the duration of the COVID-19 declaration  under Section 564(b)(1) of the Act, 21 U.S.C. section 360bbb-3(b)(1), unless the authorization is terminated or revoked.  Performed at [facility], [address], [place], [place] [address]      Signed: Melina Schools Elisah Parmer  [facility] 05/14/2021, 9:01 AM

## 2021-05-14 NOTE — Social Work (Signed)
CSW set up transportation back to address listed on file. Pt daughter was dropped off at the hospital to ride with him back home bc she does not think he can ride alone due to mentation at baseline.

## 2021-05-19 ENCOUNTER — Encounter: Payer: Self-pay | Admitting: Neurology

## 2021-06-13 NOTE — Congregational Nurse Program (Signed)
°  Dept: [phone]   Congregational Nurse Program Note  Date of Encounter: 06/09/2021  Past Medical History: Past Medical History:  Diagnosis Date   Homeless    Stroke [facility])     Encounter Details:  CNP Questionnaire - 06/08/21 1737       Questionnaire   Do you give verbal consent to treat you today? Yes    Location Patient Served  Home of [address], [employer] or Organization    Patient Status Homeless    Insurance [place];Medicare    Insurance Referral N/A    Medication Have Medication Insecurities    Medical Provider No    Screening Referrals N/A    Medical Referral ED    Medical Appointment Made N/A    Food Have Food Insecurities    Transportation Need transportation assistance    Housing/Utilities No permanent housing    Interpersonal Safety N/A    Intervention Blood pressure           Stated he has history of seizures but does not have any medication at the present. Other health issues- chronic right knee pain, hypertension, history of mild stroke last year BP 191/116 HR 69. Discussed dangers of not taking seizure medication and having an elevated. Asked if he wanted to be  seen in ED  and he agreed. EMS called and patient was transported to [facility]

## 2021-06-14 ENCOUNTER — Other Ambulatory Visit: Payer: Self-pay

## 2021-06-14 ENCOUNTER — Ambulatory Visit (HOSPITAL_COMMUNITY): Payer: Medicare Other | Admitting: Speech Pathology

## 2021-06-14 ENCOUNTER — Ambulatory Visit (HOSPITAL_COMMUNITY): Payer: Medicare Other | Attending: Internal Medicine | Admitting: Physical Therapy

## 2021-06-15 ENCOUNTER — Ambulatory Visit (HOSPITAL_COMMUNITY): Payer: Medicare Other

## 2021-06-16 ENCOUNTER — Ambulatory Visit: Payer: Medicare Other | Admitting: Neurology

## 2021-06-22 NOTE — Congregational Nurse Program (Signed)
°  Dept: [phone]   Congregational Nurse Program Note  Date of Encounter: 06/22/2021  Past Medical History: Past Medical History:  Diagnosis Date   Homeless    Stroke [facility])     Encounter Details:  CNP Questionnaire - 06/21/21 1735       Questionnaire   Do you give verbal consent to treat you today? Yes    Location Patient Served  Home of [address], [employer] or Organization    Patient Status Homeless    Insurance [place];Medicare    Insurance Referral N/A    Medication N/A    Medical Provider Yes    Screening Referrals N/A    Medical Referral N/A    Medical Appointment Made Non-[facility] PCP/clinic    Food Have Food Insecurities    Transportation Need transportation assistance    Housing/Utilities No permanent housing    Interpersonal Safety N/A    Intervention Blood pressure;Educate    ED Visit Averted N/A    Life-Saving Intervention Made N/A               Dept: [phone]   Congregational Nurse Program Note  Date of Encounter: 06/22/2021  Past Medical History: Past Medical History:  Diagnosis Date   Homeless    Stroke [facility])     Encounter Details:  CNP Questionnaire - 06/21/21 1735       Questionnaire   Do you give verbal consent to treat you today? Yes    Location Patient Served  Home of [address], [employer] or Organization    Patient Status Homeless    Insurance [place];Medicare    Insurance Referral N/A    Medication N/A    Medical Provider Yes    Screening Referrals N/A    Medical Referral N/A    Medical Appointment Made Non-[facility] PCP/clinic    Food Have Food Insecurities    Transportation Need transportation assistance    Housing/Utilities No permanent housing    Interpersonal Safety N/A    Intervention Blood pressure;Educate    ED Visit Averted N/A    Life-Saving Intervention Made N/A           Has an appointment at [facility] on January 31st 9:00am. Stated he  has been watching his diet and feels pretty  Good today. BP 179/109 P-71 Jenene Slicker RN

## 2021-06-23 ENCOUNTER — Encounter: Payer: Self-pay | Admitting: Neurology

## 2021-06-29 NOTE — Congregational Nurse Program (Signed)
Dept: [phone]   Congregational Nurse Program Note  Date of Encounter: 06/29/2021  Past Medical History: Past Medical History:  Diagnosis Date   Homeless    Stroke [facility])     Encounter Details:  CNP Questionnaire - 06/21/21 1745       Questionnaire   Do you give verbal consent to treat you today? Yes    Location Patient Served  Home of [address], [employer] or Organization    Patient Status Homeless    Insurance [place]Medicare    Insurance Referral N/A    Medication N/A    Medical Provider No    Screening Referrals N/A    Medical Referral N/A    Medical Appointment Made Non-[facility] P[facility]clinic    Food Have Food Insecurities    Transportation Need transportation assistance    Housing/Utilities No permanent housing    Interpersonal Safety N/A    Intervention Blood pressure;Educate    ED Visit Averted N/A    Life-Saving Intervention Made N/A            Dept: [phone]   Congregational Nurse Program Note  Date of Encounter: 06/29/2021  Past Medical History: Past Medical History:  Diagnosis Date   Homeless    Stroke [facility])     Encounter Details:  CNP Questionnaire - 06/21/21 1745       Questionnaire   Do you give verbal consent to treat you today? Yes    Location Patient Served  Home of [address], [employer] or Organization    Patient Status Homeless    Insurance [place];Medicare    Insurance Referral N/A    Medication N/A    Medical Provider No    Screening Referrals N/A    Medical Referral N/A    Medical Appointment Made Non-[facility] P[facility]clinic    Food Have Food Insecurities    Transportation Need transportation assistance    Housing/Utilities No permanent housing    Interpersonal Safety N/A    Intervention Blood pressure;Educate    ED Visit Averted N/A    Life-Saving Intervention Made N/A               Dept: [phone]   Congregational Nurse Program Note  Date of Encounter:  06/29/2021  Past Medical History: Past Medical History:  Diagnosis Date   Homeless    Stroke [facility])     Encounter Details:  CNP Questionnaire - 06/21/21 1745       Questionnaire   Do you give verbal consent to treat you today? Yes    Location Patient Served  Home of [address], [employer] or Organization    Patient Status Homeless    Insurance [place];Medicare    Insurance Referral N/A    Medication N/A    Medical Provider No    Screening Referrals N/A    Medical Referral N/A    Medical Appointment Made [facility]clinic    Food Have Food Insecurities    Transportation Need transportation assistance    Housing/Utilities No permanent housing    Interpersonal Safety N/A    Intervention Blood pressure;Educate    ED Visit Averted N/A    Life-Saving Intervention Made N/A               Dept: [phone]   Congregational Nurse Program Note  Date of Encounter: 06/29/2021  Past Medical History: Past Medical History:  Diagnosis Date   Homeless  Stroke [facility])     Encounter Details:  CNP Questionnaire - 06/21/21 1745       Questionnaire   Do you give verbal consent to treat you today? Yes    Location Patient Served  Home of [address], [employer] or Organization    Patient Status Homeless    Insurance [place]Medicare    Insurance Referral N/A    Medication N/A    Medical Provider No    Screening Referrals N/A    Medical Referral N/A    Medical Appointment Made [facility]    Food Have Food Insecurities    Transportation Need transportation assistance    Housing/Utilities No permanent housing    Interpersonal Safety N/A    Intervention Blood pressure;Educate    ED Visit Averted N/A    Life-Saving Intervention Made N/A               Dept: [phone]   Congregational Nurse Program Note  Date of Encounter: 06/29/2021  Past Medical History: Past Medical History:  Diagnosis Date    Homeless    Stroke [facility])     Encounter Details:  CNP Questionnaire - 06/21/21 1745       Questionnaire   Do you give verbal consent to treat you today? Yes    Location Patient Served  Home of [address], [employer] or Organization    Patient Status Homeless    Insurance [place];Medicare    Insurance Referral N/A    Medication N/A    Medical Provider No    Screening Referrals N/A    Medical Referral N/A    Medical Appointment Made [facility]inic    Food Have Food Insecurities    Transportation Need transportation assistance    Housing/Utilities No permanent housing    Interpersonal Safety N/A    Intervention Blood pressure;Educate    ED Visit Averted N/A    Life-Saving Intervention Made N/A           Stated he forgot his appointment at [facility] . No complaints  today Told him I will call and reschedule and reminded him of the importance of keeping his appointments BP  196/108  P 62 Asked if he wanted to be seen in the ER and he declined Jenene Slicker RN

## 2021-06-30 ENCOUNTER — Other Ambulatory Visit: Payer: Self-pay

## 2021-07-07 ENCOUNTER — Ambulatory Visit (INDEPENDENT_AMBULATORY_CARE_PROVIDER_SITE_OTHER): Payer: Medicare Other | Admitting: Neurology

## 2021-07-07 ENCOUNTER — Encounter: Payer: Self-pay | Admitting: Neurology

## 2021-07-07 ENCOUNTER — Other Ambulatory Visit: Payer: Self-pay

## 2021-07-07 VITALS — BP 122/78 | HR 59 | Ht 76.0 in | Wt 175.6 lb

## 2021-07-07 DIAGNOSIS — R471 Dysarthria and anarthria: Secondary | ICD-10-CM | POA: Diagnosis not present

## 2021-07-07 DIAGNOSIS — R29898 Other symptoms and signs involving the musculoskeletal system: Secondary | ICD-10-CM

## 2021-07-07 DIAGNOSIS — G40009 Localization-related (focal) (partial) idiopathic epilepsy and epileptic syndromes with seizures of localized onset, not intractable, without status epilepticus: Secondary | ICD-10-CM

## 2021-07-07 DIAGNOSIS — R292 Abnormal reflex: Secondary | ICD-10-CM | POA: Diagnosis not present

## 2021-07-07 MED ORDER — DIVALPROEX SODIUM 500 MG PO DR TAB: 500.0000 mg | DELAYED_RELEASE_TABLET | Freq: Two times a day (BID) | ORAL | 11 refills | Status: AC

## 2021-07-07 NOTE — Progress Notes (Signed)
Samples of this drug were given to the patient, quantity 4 boxes , Lot Number [id number] 06/2022 (2) [id number] 12/2021 (2)

## 2021-07-07 NOTE — Patient Instructions (Addendum)
Continue Depakote 500mg  twice a day  2. Schedule EMG/NCV of the right arm and leg  3. Referral will be sent to Speech Therapy  4. For the visiting nurse, please contact the [facility] congregational nurse program in [place]: [phone] to connect with them  5. Follow-up in 4 months, call for any changes   Seizure Precautions: 1. If medication has been prescribed for you to prevent seizures, take it exactly as directed.  Do not stop taking the medicine without talking to your doctor first, even if you have not had a seizure in a long time.   2. Avoid activities in which a seizure would cause danger to yourself or to others.  Don't operate dangerous machinery, swim alone, or climb in high or dangerous places, such as on ladders, roofs, or girders.  Do not drive unless your doctor says you may.  3. If you have any warning that you may have a seizure, lay down in a safe place where you can't hurt yourself.    4.  No driving for 6 months from last seizure, as per [facility]   Please refer to the following link on the Epilepsy Foundation of America's website for more information: http://www.epilepsyfoundation.org/answerplace/Social/driving/drivingu.cfm   5.  Maintain good sleep hygiene. Avoid alcohol.  6.  Contact your doctor if you have any problems that may be related to the medicine you are taking.  7.  Call 911 and bring the patient back to the ED if:        A.  The seizure lasts longer than 5 minutes.       B.  The patient doesn't awaken shortly after the seizure  C.  The patient has new problems such as difficulty seeing, speaking or moving  D.  The patient was injured during the seizure  E.  The patient has a temperature over 102 F (39C)  F.  The patient vomited and now is having trouble breathing
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
## 2021-08-02 ENCOUNTER — Ambulatory Visit (HOSPITAL_COMMUNITY): Payer: Medicare Other | Attending: Internal Medicine | Admitting: Speech Pathology

## 2021-08-05 ENCOUNTER — Ambulatory Visit: Payer: Medicare Other | Admitting: Internal Medicine

## 2021-08-08 ENCOUNTER — Other Ambulatory Visit: Payer: Self-pay

## 2021-08-08 ENCOUNTER — Other Ambulatory Visit (HOSPITAL_COMMUNITY): Payer: Self-pay

## 2021-08-11 ENCOUNTER — Encounter: Payer: Medicare Other | Admitting: Neurology

## 2021-08-30 ENCOUNTER — Ambulatory Visit (INDEPENDENT_AMBULATORY_CARE_PROVIDER_SITE_OTHER): Payer: No Typology Code available for payment source | Admitting: Neurology

## 2021-08-30 DIAGNOSIS — G40009 Localization-related (focal) (partial) idiopathic epilepsy and epileptic syndromes with seizures of localized onset, not intractable, without status epilepticus: Secondary | ICD-10-CM | POA: Diagnosis not present

## 2021-08-30 DIAGNOSIS — R29898 Other symptoms and signs involving the musculoskeletal system: Secondary | ICD-10-CM

## 2021-08-30 DIAGNOSIS — R471 Dysarthria and anarthria: Secondary | ICD-10-CM

## 2021-08-30 DIAGNOSIS — R292 Abnormal reflex: Secondary | ICD-10-CM

## 2021-08-30 NOTE — Procedures (Signed)
[facility]  ?[address], [address] ? [place], [place] [address] ?Tel: [phone] ?Fax:  [phone] ?Test Date:  08/30/2021 ? ?Patient: Arthur [name] DOB: 1971-02-23 Physician: Narda Amber, DO  ?Sex: Male Height: 6\' 4"  Ref Phys: Ellouise Newer, M.D.  ?ID#: [id number]   Technician:   ? ?Patient Complaints: ?This is a 51 year old man referred for evaluation of right-sided weakness. ? ?NCV & EMG Findings: ?Extensive electrodiagnostic testing of the right upper and lower extremity shows: ?All sensory responses including the right median, ulnar, mixed palmar, sural, and superficial peroneal nerves are within normal limits. ?All motor responses including the right median, ulnar, peroneal, and tibial nerves are within normal limits. ?Right tibial H reflex study is within normal limits. ?There is no evidence of active or chronic motor axonal loss changes affecting any of the tested muscles.  Motor unit configuration and recruitment pattern is within normal limits. ? ?Impression: ?This is a normal study of the right upper and lower extremities.  In particular, there is no evidence of a large fiber sensorimotor polyneuropathy, cervical/lumbosacral radiculopathy, or diffuse myopathy. ? ? ?___________________________ ?Narda Amber, DO ? ? ? ?Nerve Conduction Studies ?Anti Sensory Summary Table ? ? Stim Site NR Peak (ms) Norm Peak (ms) P-T Amp (?V) Norm P-T Amp  ?Right Median Anti Sensory (2nd Digit)  36?C  ?Wrist    3.2 <3.6 24.7 >15  ?Right Sup Peroneal Anti Sensory (Ant Lat Mall)  36?C  ?12 cm    2.6 <4.6 6.4 >4  ?Right Sural Anti Sensory (Lat Mall)  36?C  ?Calf    2.5 <4.6 15.3 >4  ?Right Ulnar Anti Sensory (5th Digit)  36?C  ?Wrist    2.6 <3.1 25.8 >10  ? ?Motor Summary Table ? ? Stim Site NR Onset (ms) Norm Onset (ms) O-P Amp (mV) Norm O-P Amp Site1 Site2 Delta-0 (ms) Dist (cm) Vel (m/s) Norm Vel (m/s)  ?Right Median Motor (Abd Poll Brev)  36?C  ?Wrist    3.0 <4.0 8.6 >6 Elbow Wrist 5.1 31.0 61 >50   ?Elbow    8.1  8.1         ?Right Peroneal Motor (Ext Dig Brev)  36?C  ?Ankle    3.1 <6.0 3.6 >2.5 B Fib Ankle 7.8 38.0 49 >40  ?B Fib    10.9  3.3  Poplt B Fib 1.4 8.0 57 >40  ?Poplt    12.3  3.3         ?Right Tibial Motor (Abd [place] Crane Brev)  36?C  ?Ankle    4.6 <6.0 8.4 >4 Knee Ankle 9.1 43.0 47 >40  ?Knee    13.7  6.5         ?Right Ulnar Motor (Abd Dig Minimi)  36?C  ?Wrist    2.0 <3.1 12.1 >7 B Elbow Wrist 4.1 24.0 59 >50  ?B Elbow    6.1  11.3  A Elbow B Elbow 1.6 10.0 62 >50  ?A Elbow    7.7  10.9         ? ?Comparison Summary Table ? ? Stim Site NR Peak (ms) Norm Peak (ms) P-T Amp (?V) Site1 Site2 Delta-P (ms) Norm Delta (ms)  ?Right Median/Ulnar Palm Comparison (Wrist - 8cm)  36?C  ?Median Palm    1.8 <2.2 47.7 Median Palm Ulnar Palm 0.3   ?Ulnar Palm    1.5 <2.2 17.6      ? ?H Reflex Studies ? ? NR H-Lat (ms) Lat Norm (ms) L-R H-Lat (  ms)  ?Right Tibial (Gastroc)  36?C  ?   33.74 <35   ? ?EMG ? ? Side Muscle Ins Act Fibs Psw Fasc Number Recrt Dur Dur. Amp Amp. Poly Poly. Comment  ?Right AntTibialis Nml Nml Nml Nml Nml Nml Nml Nml Nml Nml Nml Nml N/A  ?Right Gastroc Nml Nml Nml Nml Nml Nml Nml Nml Nml Nml Nml Nml N/A  ?Right Flex Dig Long Nml Nml Nml Nml Nml Nml Nml Nml Nml Nml Nml Nml N/A  ?Right RectFemoris Nml Nml Nml Nml Nml Nml Nml Nml Nml Nml Nml Nml N/A  ?Right BicepsFemS Nml Nml Nml Nml Nml Nml Nml Nml Nml Nml Nml Nml N/A  ?Right 1stDorInt Nml Nml Nml Nml Nml Nml Nml Nml Nml Nml Nml Nml N/A  ?Right PronatorTeres Nml Nml Nml Nml Nml Nml Nml Nml Nml Nml Nml Nml N/A  ?Right Biceps Nml Nml Nml Nml Nml Nml Nml Nml Nml Nml Nml Nml N/A  ?Right Triceps Nml Nml Nml Nml Nml Nml Nml Nml Nml Nml Nml Nml N/A  ?Right Deltoid Nml Nml Nml Nml Nml Nml Nml Nml Nml Nml Nml Nml N/A  ? ? ? ? ?Waveforms: ?    ? ?    ? ?    ? ?  ? ? ?

## 2021-10-31 ENCOUNTER — Ambulatory Visit: Payer: Self-pay | Admitting: Neurology

## 2021-10-31 DIAGNOSIS — Z029 Encounter for administrative examinations, unspecified: Secondary | ICD-10-CM

## 2021-11-01 ENCOUNTER — Encounter: Payer: Self-pay | Admitting: Neurology

## 2021-11-01 ENCOUNTER — Ambulatory Visit: Payer: Medicare Other | Admitting: Internal Medicine

## 2021-12-01 ENCOUNTER — Ambulatory Visit: Payer: Medicare Other | Admitting: Internal Medicine

## 2022-01-28 DIAGNOSIS — T2025XA Burn of second degree of scalp [any part], initial encounter: Secondary | ICD-10-CM | POA: Diagnosis not present

## 2022-01-28 DIAGNOSIS — T2020XA Burn of second degree of head, face, and neck, unspecified site, initial encounter: Secondary | ICD-10-CM | POA: Diagnosis not present

## 2022-01-28 DIAGNOSIS — X088XXA Exposure to other specified smoke, fire and flames, initial encounter: Secondary | ICD-10-CM | POA: Diagnosis not present

## 2022-01-28 DIAGNOSIS — T23202A Burn of second degree of left hand, unspecified site, initial encounter: Secondary | ICD-10-CM | POA: Diagnosis not present

## 2022-01-28 DIAGNOSIS — T23001A Burn of unspecified degree of right hand, unspecified site, initial encounter: Secondary | ICD-10-CM | POA: Diagnosis not present

## 2022-01-28 DIAGNOSIS — R52 Pain, unspecified: Secondary | ICD-10-CM | POA: Diagnosis not present

## 2022-01-28 DIAGNOSIS — E785 Hyperlipidemia, unspecified: Secondary | ICD-10-CM | POA: Diagnosis not present

## 2022-01-28 DIAGNOSIS — T23002A Burn of unspecified degree of left hand, unspecified site, initial encounter: Secondary | ICD-10-CM | POA: Diagnosis not present

## 2022-01-28 DIAGNOSIS — T300 Burn of unspecified body region, unspecified degree: Secondary | ICD-10-CM | POA: Diagnosis not present

## 2022-01-28 DIAGNOSIS — R0689 Other abnormalities of breathing: Secondary | ICD-10-CM | POA: Diagnosis not present

## 2022-01-28 DIAGNOSIS — Z23 Encounter for immunization: Secondary | ICD-10-CM | POA: Diagnosis not present

## 2022-01-28 DIAGNOSIS — Y999 Unspecified external cause status: Secondary | ICD-10-CM | POA: Diagnosis not present

## 2022-01-28 DIAGNOSIS — F17210 Nicotine dependence, cigarettes, uncomplicated: Secondary | ICD-10-CM | POA: Diagnosis not present

## 2022-01-28 DIAGNOSIS — T3110 Burns involving 10-19% of body surface with 0% to 9% third degree burns: Secondary | ICD-10-CM | POA: Diagnosis not present

## 2022-01-28 DIAGNOSIS — T310 Burns involving less than 10% of body surface: Secondary | ICD-10-CM | POA: Diagnosis not present

## 2022-01-28 DIAGNOSIS — I10 Essential (primary) hypertension: Secondary | ICD-10-CM | POA: Diagnosis not present

## 2022-01-28 DIAGNOSIS — T23201A Burn of second degree of right hand, unspecified site, initial encounter: Secondary | ICD-10-CM | POA: Diagnosis not present

## 2022-04-15 DIAGNOSIS — Z20822 Contact with and (suspected) exposure to covid-19: Secondary | ICD-10-CM | POA: Diagnosis not present

## 2022-04-15 DIAGNOSIS — F322 Major depressive disorder, single episode, severe without psychotic features: Secondary | ICD-10-CM | POA: Diagnosis not present

## 2022-04-15 DIAGNOSIS — R079 Chest pain, unspecified: Secondary | ICD-10-CM | POA: Diagnosis not present

## 2022-04-15 DIAGNOSIS — R45851 Suicidal ideations: Secondary | ICD-10-CM | POA: Diagnosis not present

## 2022-04-15 DIAGNOSIS — M940 Chondrocostal junction syndrome [Tietze]: Secondary | ICD-10-CM | POA: Diagnosis not present

## 2022-04-15 DIAGNOSIS — R0689 Other abnormalities of breathing: Secondary | ICD-10-CM | POA: Diagnosis not present

## 2022-04-15 DIAGNOSIS — F329 Major depressive disorder, single episode, unspecified: Secondary | ICD-10-CM | POA: Diagnosis not present

## 2022-04-15 DIAGNOSIS — T68XXXA Hypothermia, initial encounter: Secondary | ICD-10-CM | POA: Diagnosis not present

## 2022-04-15 DIAGNOSIS — R0789 Other chest pain: Secondary | ICD-10-CM | POA: Diagnosis not present

## 2022-04-15 DIAGNOSIS — Z8673 Personal history of transient ischemic attack (TIA), and cerebral infarction without residual deficits: Secondary | ICD-10-CM | POA: Diagnosis not present

## 2022-04-15 DIAGNOSIS — I10 Essential (primary) hypertension: Secondary | ICD-10-CM | POA: Diagnosis not present

## 2022-04-15 DIAGNOSIS — R9431 Abnormal electrocardiogram [ECG] [EKG]: Secondary | ICD-10-CM | POA: Diagnosis not present

## 2022-04-15 DIAGNOSIS — F1510 Other stimulant abuse, uncomplicated: Secondary | ICD-10-CM | POA: Diagnosis not present

## 2022-04-15 DIAGNOSIS — E785 Hyperlipidemia, unspecified: Secondary | ICD-10-CM | POA: Diagnosis not present

## 2022-04-15 DIAGNOSIS — G40909 Epilepsy, unspecified, not intractable, without status epilepticus: Secondary | ICD-10-CM | POA: Diagnosis not present

## 2022-04-15 DIAGNOSIS — F17210 Nicotine dependence, cigarettes, uncomplicated: Secondary | ICD-10-CM | POA: Diagnosis not present

## 2022-04-18 DIAGNOSIS — Z5986 Financial insecurity: Secondary | ICD-10-CM | POA: Diagnosis not present

## 2022-04-18 DIAGNOSIS — F251 Schizoaffective disorder, depressive type: Secondary | ICD-10-CM | POA: Diagnosis not present

## 2022-04-18 DIAGNOSIS — T2049XD Corrosion of unspecified degree of multiple sites of head, face, and neck, subsequent encounter: Secondary | ICD-10-CM | POA: Diagnosis not present

## 2022-04-18 DIAGNOSIS — R45851 Suicidal ideations: Secondary | ICD-10-CM | POA: Diagnosis not present

## 2022-04-18 DIAGNOSIS — M792 Neuralgia and neuritis, unspecified: Secondary | ICD-10-CM | POA: Diagnosis not present

## 2022-04-18 DIAGNOSIS — I10 Essential (primary) hypertension: Secondary | ICD-10-CM | POA: Diagnosis not present

## 2022-04-18 DIAGNOSIS — F339 Major depressive disorder, recurrent, unspecified: Secondary | ICD-10-CM | POA: Diagnosis not present

## 2022-04-18 DIAGNOSIS — T23401D Corrosion of unspecified degree of right hand, unspecified site, subsequent encounter: Secondary | ICD-10-CM | POA: Diagnosis not present

## 2022-04-18 DIAGNOSIS — Z9151 Personal history of suicidal behavior: Secondary | ICD-10-CM | POA: Diagnosis not present

## 2022-04-18 DIAGNOSIS — Z5902 Unsheltered homelessness: Secondary | ICD-10-CM | POA: Diagnosis not present

## 2022-04-18 DIAGNOSIS — F1520 Other stimulant dependence, uncomplicated: Secondary | ICD-10-CM | POA: Diagnosis not present

## 2022-04-25 DIAGNOSIS — F251 Schizoaffective disorder, depressive type: Secondary | ICD-10-CM | POA: Diagnosis not present

## 2022-04-29 DIAGNOSIS — F1520 Other stimulant dependence, uncomplicated: Secondary | ICD-10-CM | POA: Diagnosis not present

## 2022-04-29 DIAGNOSIS — T23401D Corrosion of unspecified degree of right hand, unspecified site, subsequent encounter: Secondary | ICD-10-CM | POA: Diagnosis not present

## 2022-04-29 DIAGNOSIS — Z9151 Personal history of suicidal behavior: Secondary | ICD-10-CM | POA: Diagnosis not present

## 2022-04-29 DIAGNOSIS — T2049XD Corrosion of unspecified degree of multiple sites of head, face, and neck, subsequent encounter: Secondary | ICD-10-CM | POA: Diagnosis not present

## 2022-04-29 DIAGNOSIS — M792 Neuralgia and neuritis, unspecified: Secondary | ICD-10-CM | POA: Diagnosis not present

## 2022-04-29 DIAGNOSIS — I10 Essential (primary) hypertension: Secondary | ICD-10-CM | POA: Diagnosis not present

## 2022-04-29 DIAGNOSIS — Z5902 Unsheltered homelessness: Secondary | ICD-10-CM | POA: Diagnosis not present

## 2022-04-29 DIAGNOSIS — Z5986 Financial insecurity: Secondary | ICD-10-CM | POA: Diagnosis not present

## 2022-04-29 DIAGNOSIS — R45851 Suicidal ideations: Secondary | ICD-10-CM | POA: Diagnosis not present

## 2022-04-29 DIAGNOSIS — F339 Major depressive disorder, recurrent, unspecified: Secondary | ICD-10-CM | POA: Diagnosis not present

## 2022-04-29 DIAGNOSIS — F251 Schizoaffective disorder, depressive type: Secondary | ICD-10-CM | POA: Diagnosis not present

## 2022-05-03 DIAGNOSIS — F251 Schizoaffective disorder, depressive type: Secondary | ICD-10-CM | POA: Diagnosis not present

## 2022-05-18 ENCOUNTER — Other Ambulatory Visit: Payer: Self-pay

## 2023-09-07 IMAGING — CR DG CHEST 1V
1 series · 1 of 1 positions shown · non-contrast
Comparison: None.

CLINICAL DATA: Altered mental status.

EXAM:
CHEST  1 VIEW

[x chest ap]
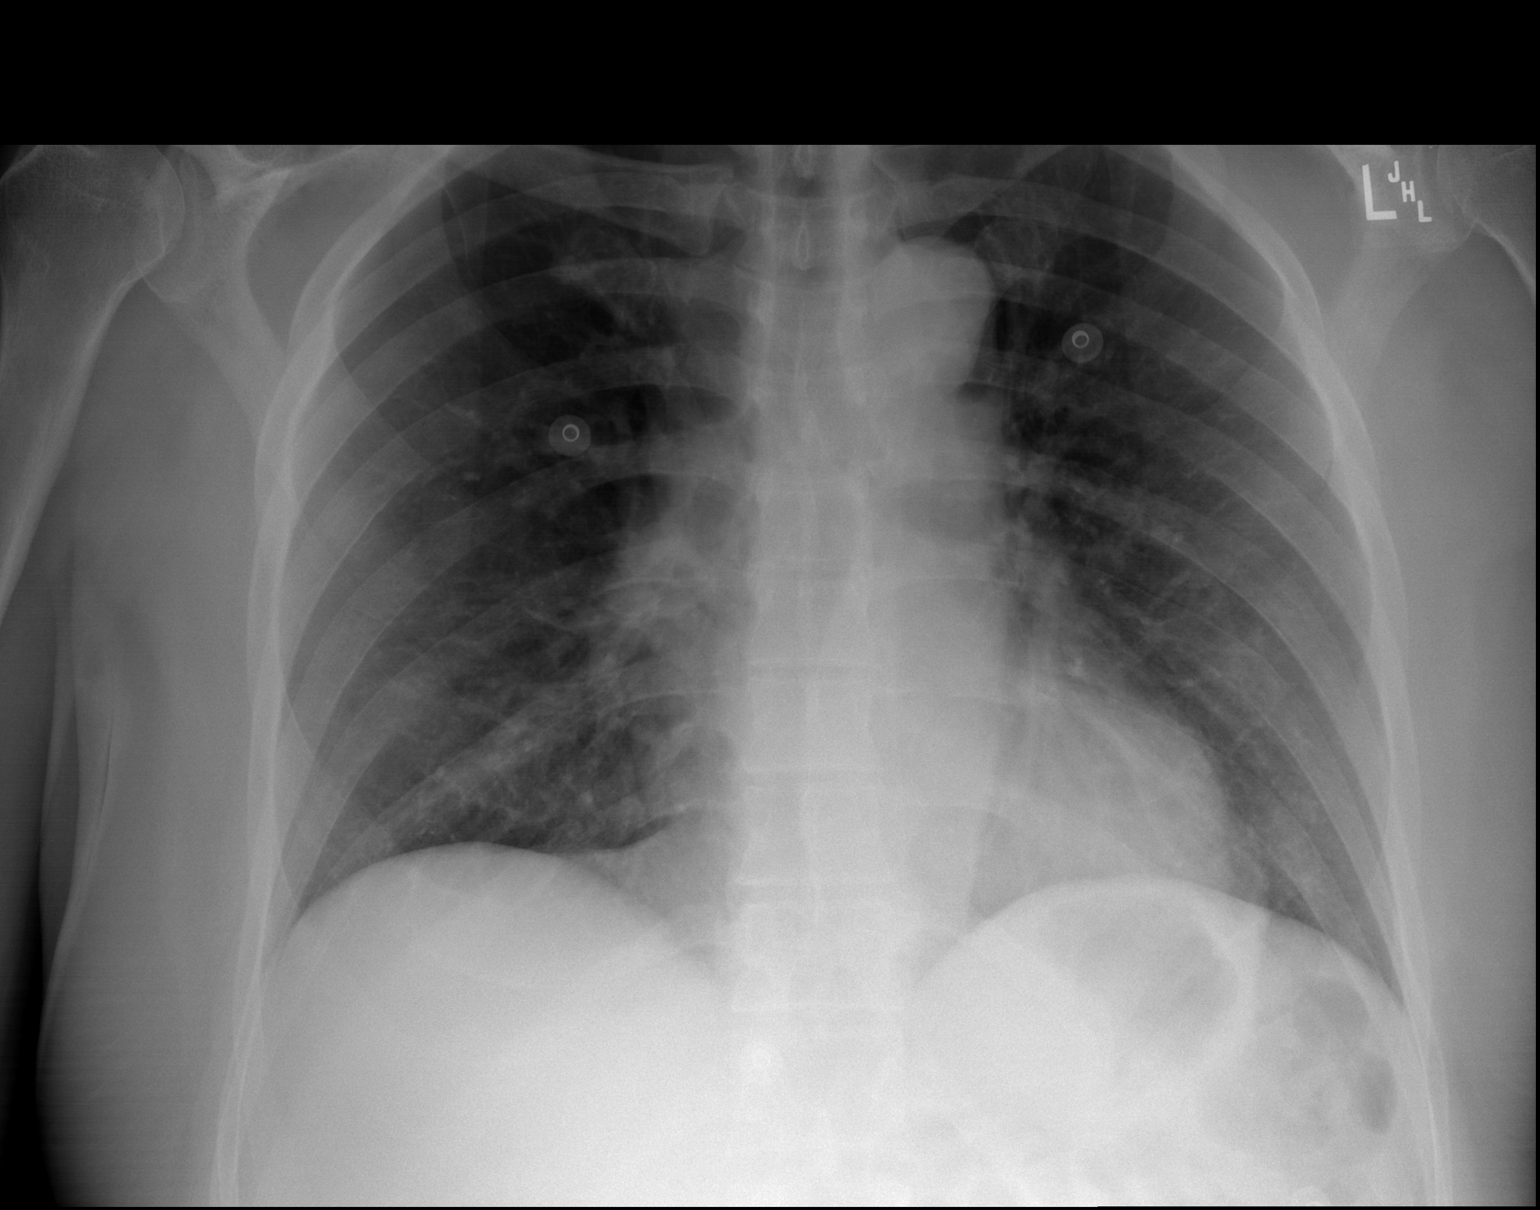

[1 of 1 positions shown; findings below may reference images not displayed]

FINDINGS: The heart size and mediastinal contours are within normal limits.
Both lungs are clear. The visualized skeletal structures are
unremarkable.
IMPRESSION: No active disease.

## 2023-09-07 IMAGING — MR MR HEAD W/O CM
10 series · 42 of 48 positions shown · non-contrast
Comparison: No prior MRI, correlation is made with CT head
05/06/2021

CLINICAL DATA: Altered mental status, confusion

EXAM:
MRI HEAD WITHOUT CONTRAST
TECHNIQUE: Multiplanar, multiecho pulse sequences of the brain and surrounding
structures were obtained without intravenous contrast.

[Series 5: dwi_tracew · axial · 3.0mm · 1.08mm/px · z∈[-45,+101]mm · 8 of 102 slices shown]
[im 1/102]
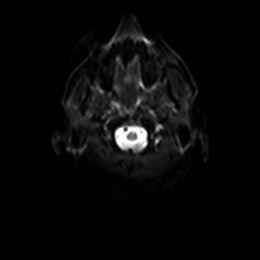
[im 21/102]
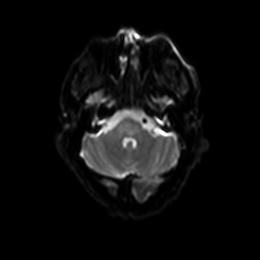
[im 31/102]
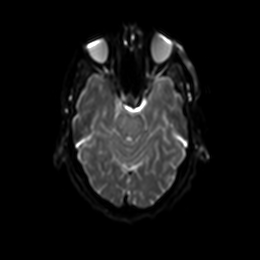
[im 41/102]
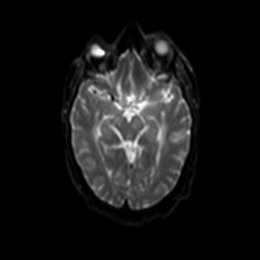
[im 61/102]
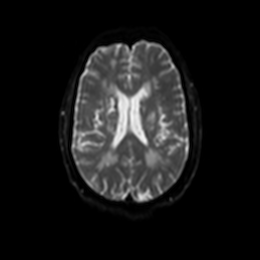
[im 71/102]
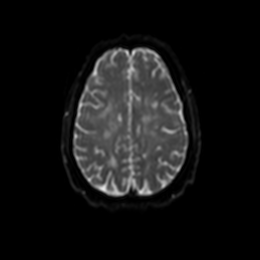
[im 81/102]
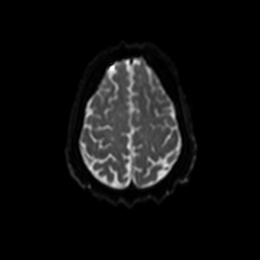
[im 102/102]
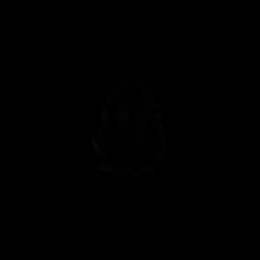

[Series 6: dwi_adc · axial · 3.0mm · 1.08mm/px · z∈[-45,-10]mm · 2 of 50 slices shown]
[im 1/50]
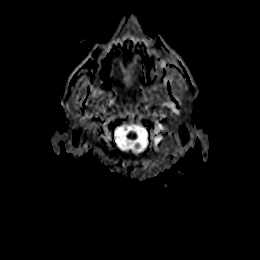
[im 13/50]
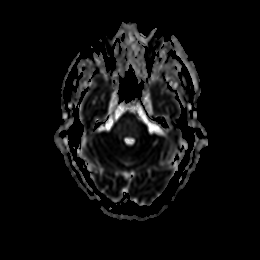

[Series 7: T2 · sagittal · 5.0mm · 0.47mm/px · 2 of 24 slices shown (1 of 3)]
[im 1/24]
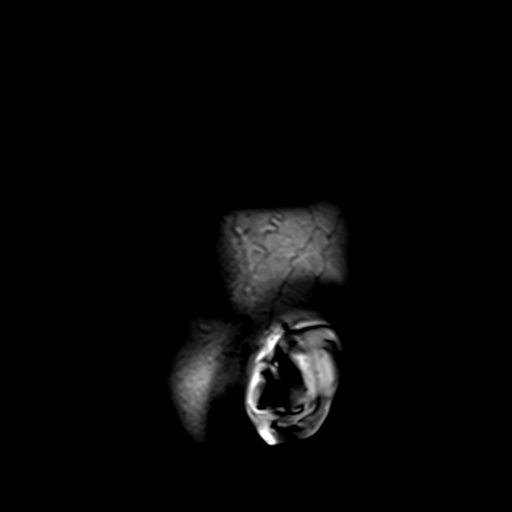
[im 24/24]
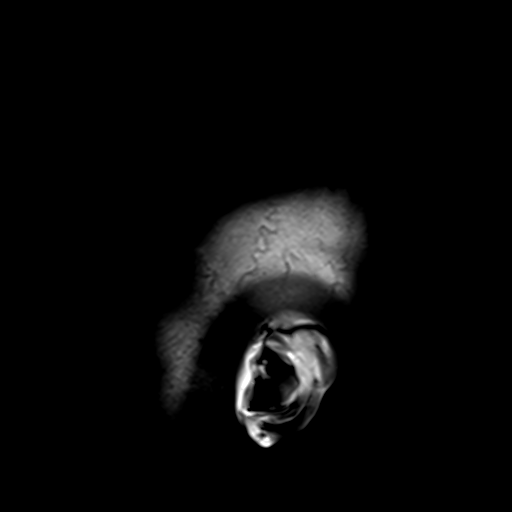

[Series 8: T2 · axial · 5.0mm · 0.45mm/px · z∈[-46,+112]mm · 3 of 26 slices shown (2 of 3)]
[im 1/26]
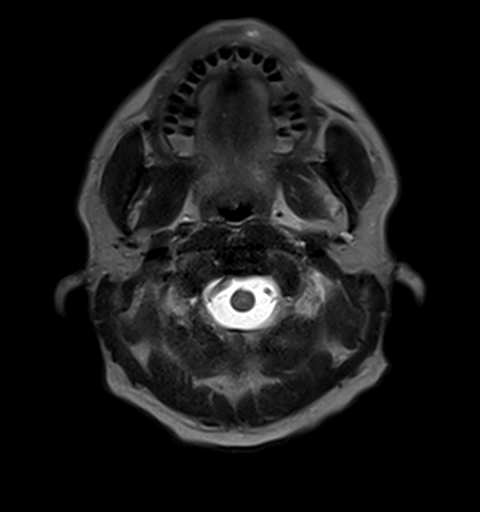
[im 13/26]
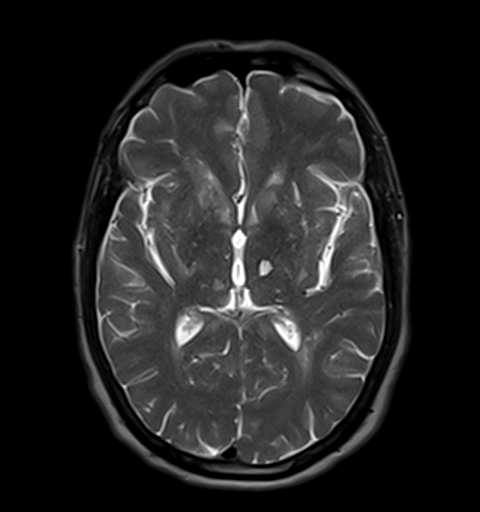
[im 26/26]
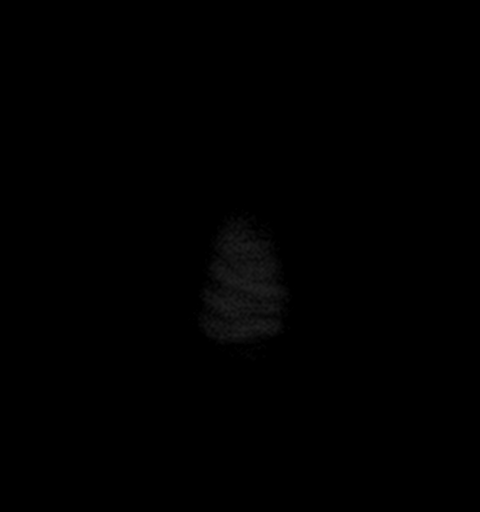

[Series 9: GRE · axial · 3.0mm · 0.45mm/px · z∈[-41,+105]mm · 5 of 51 slices shown]
[im 1/51]
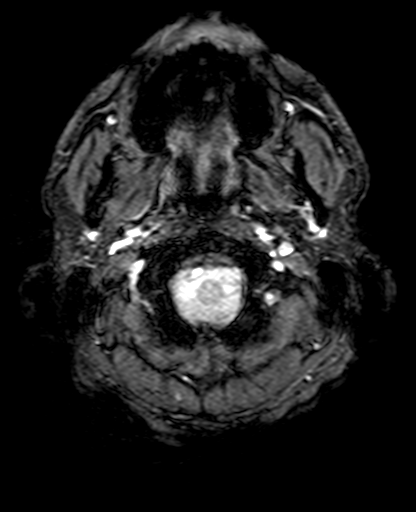
[im 13/51]
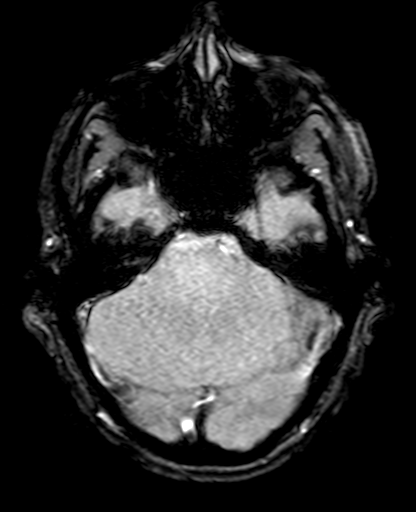
[im 26/51]
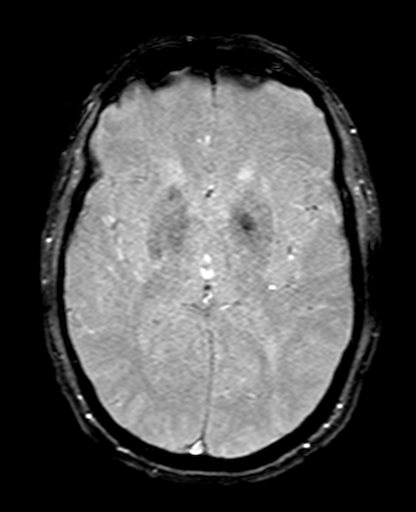
[im 38/51]
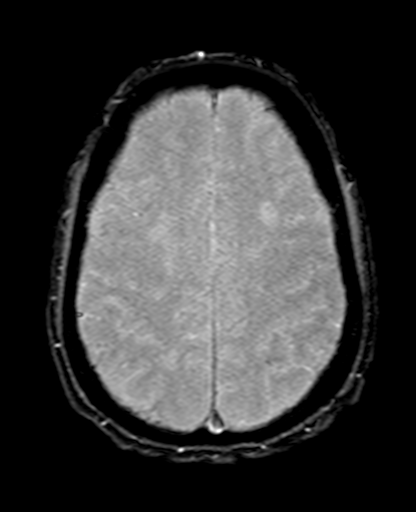
[im 51/51]
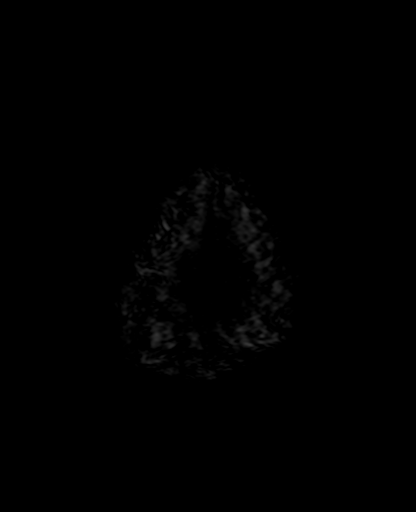

[Series 12: FLAIR · axial · 3.0mm · 0.90mm/px · z∈[-38,+108]mm · 5 of 51 slices shown]
[im 1/51]
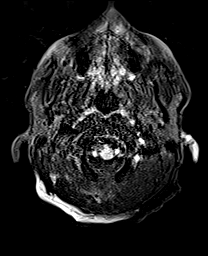
[im 13/51]
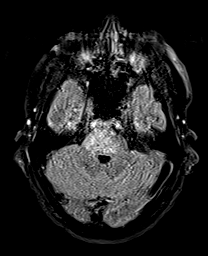
[im 26/51]
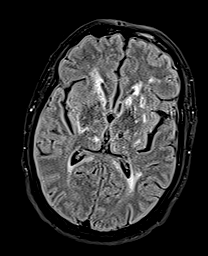
[im 38/51]
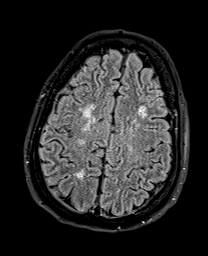
[im 51/51]
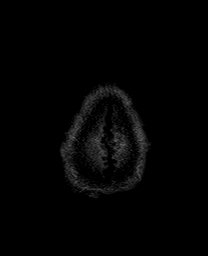

[Series 13: T1 · axial · 3.0mm · 0.45mm/px · z∈[-43,+103]mm · 5 of 51 slices shown]
[im 1/51]
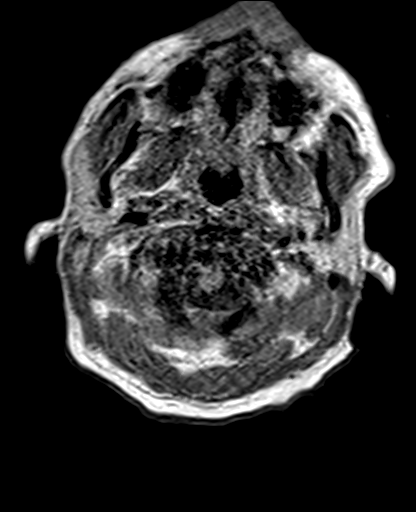
[im 13/51]
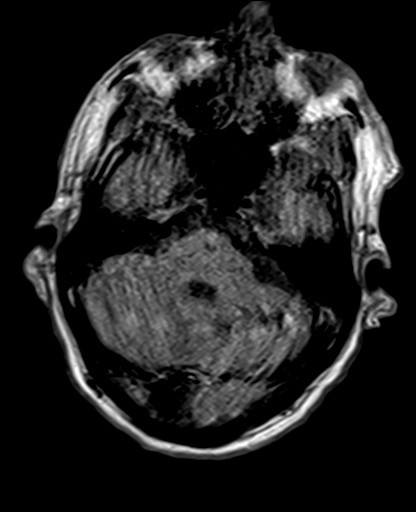
[im 26/51]
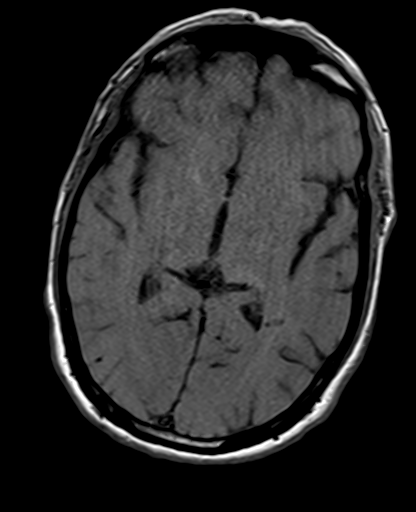
[im 38/51]
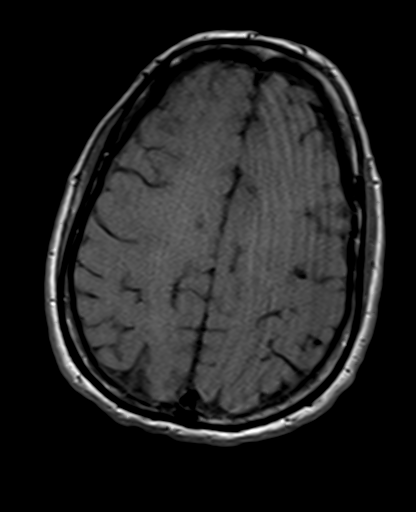
[im 51/51]
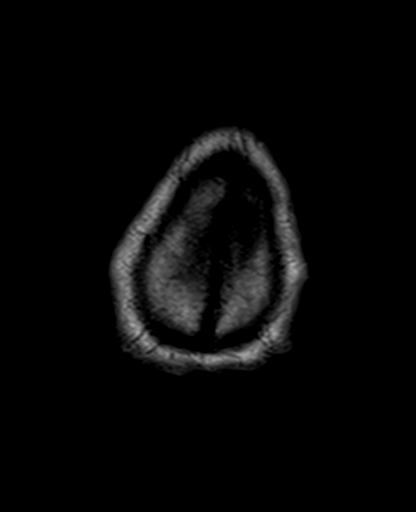

[Series 14: DWI · coronal · 5.0mm · 1.31mm/px · 6 of 64 slices shown (1 of 2)]
[im 1/64]
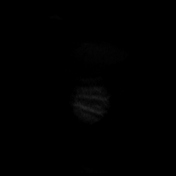
[im 13/64]
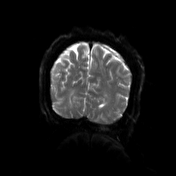
[im 26/64]
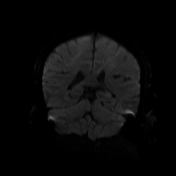
[im 38/64]
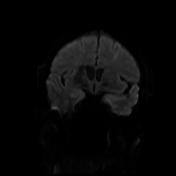
[im 51/64]
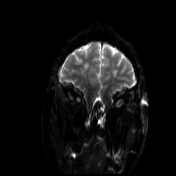
[im 64/64]
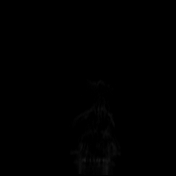

[Series 15: DWI · coronal · 5.0mm · 1.31mm/px · 3 of 32 slices shown (2 of 2)]
[im 1/32]
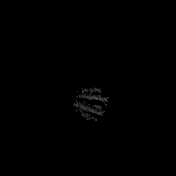
[im 16/32]
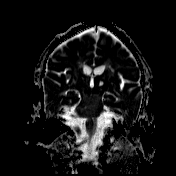
[im 32/32]
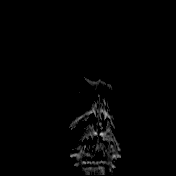

[Series 16: T2 · coronal · 5.0mm · 0.86mm/px · 3 of 32 slices shown (3 of 3)]
[im 1/32]
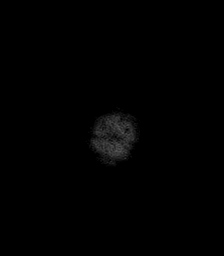
[im 16/32]
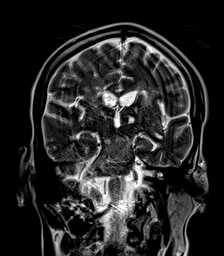
[im 32/32]
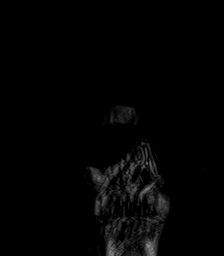

[42 of 48 positions shown; findings below may reference images not displayed]

FINDINGS: Evaluation is somewhat limited by motion artifact.

Brain: No restricted diffusion to suggest acute or subacute
infarction. No acute hemorrhage, mass, mass effect, or midline
shift. Multiple lacunar infarcts in the bilateral thalami, basal
ganglia, and corona radiata. Confluent T2 hyperintense signal in the
periventricular white matter and pons, likely the sequela of severe
chronic small vessel ischemic disease. Punctate foci of hemosiderin
deposition left frontal lobe, right occipital lobe, pons, right
thalamus, and left basal ganglia, likely sequela of prior
hypertensive microhemorrhages.

Vascular: Normal flow voids.

Skull and upper cervical spine: Normal marrow signal.

Sinuses/Orbits: Negative.

Other: None.
IMPRESSION: Evaluation is somewhat limited by motion artifact. Within this
limitation, no acute intracranial process.

## 2023-09-07 IMAGING — CT CT HEAD W/O CM
4 series · 16 of 47 positions shown, 18 images · non-contrast
Comparison: 05/03/2021

CLINICAL DATA: Mental status changes

EXAM:
CT HEAD WITHOUT CONTRAST
TECHNIQUE: Contiguous axial images were obtained from the base of the skull
through the vertex without intravenous contrast.

[Series 2: head bone · axial · 0.47mm/px · z∈[-149,-117]mm · 3 of 82 slices shown]
[im 9/82  bone]
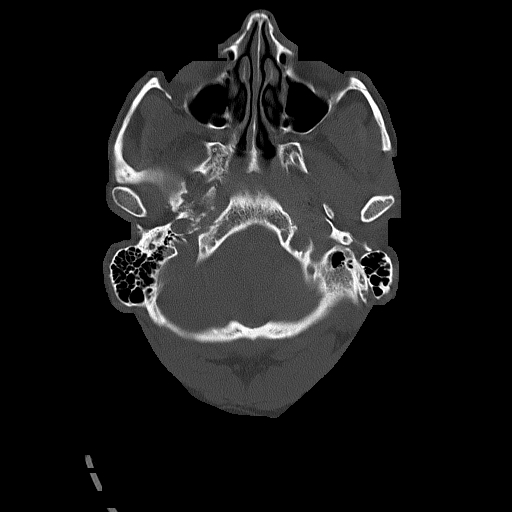
[im 17/82  bone]
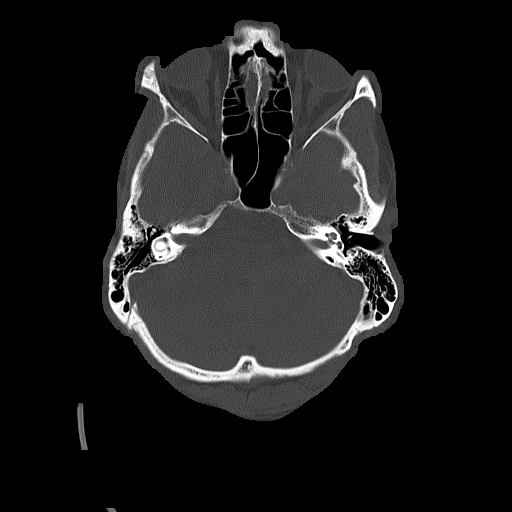
[im 25/82  bone]
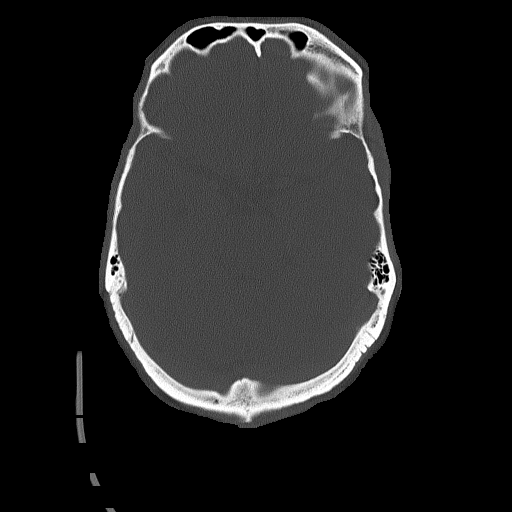

[Series 3: head wo · axial · 0.47mm/px · z∈[-145,-25]mm · 7 of 33 slices shown, 9 images]
[im 5/33  brain]
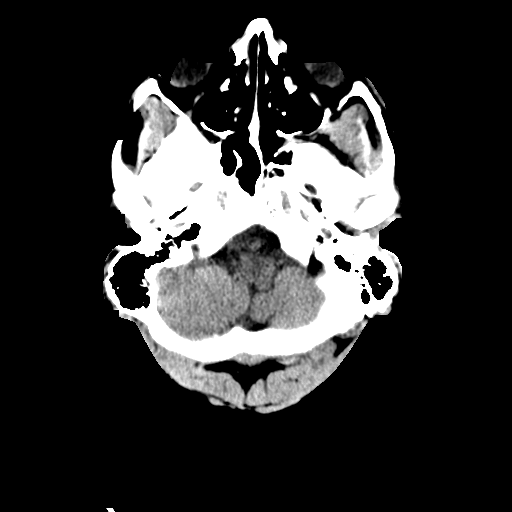
[im 5/33  bone]
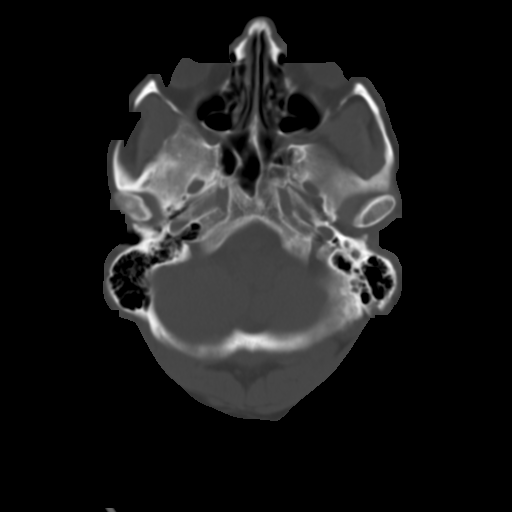
[im 9/33  brain]
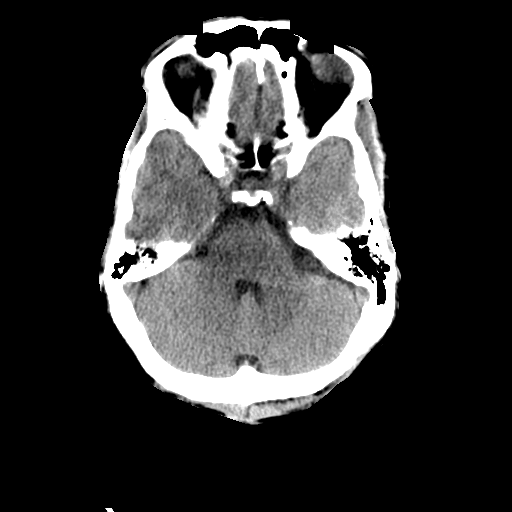
[im 13/33  brain]
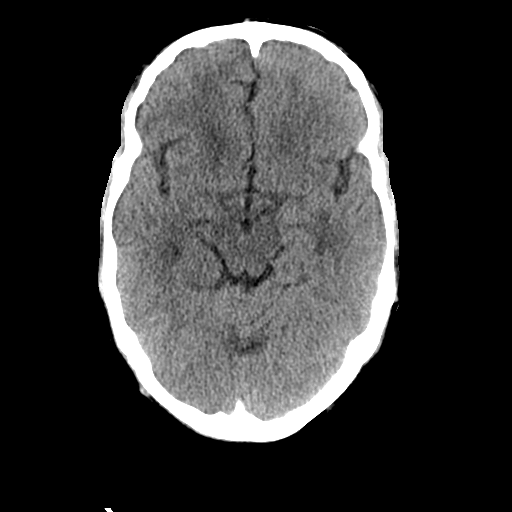
[im 17/33  brain]
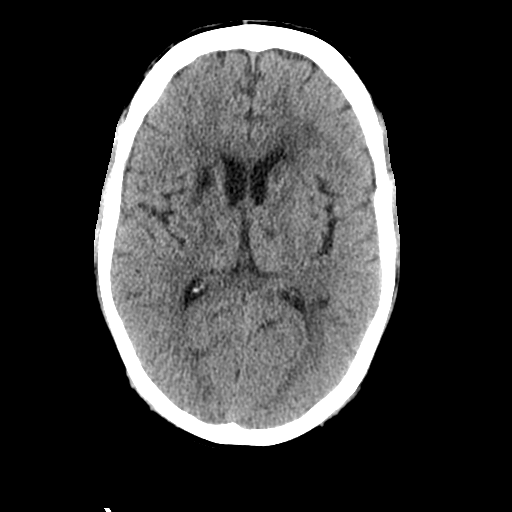
[im 21/33  brain]
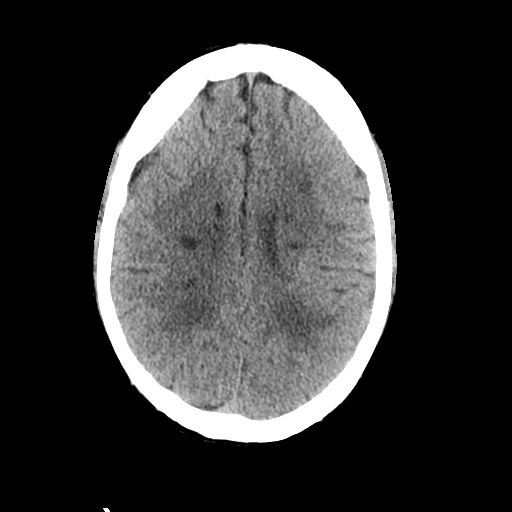
[im 21/33  bone]
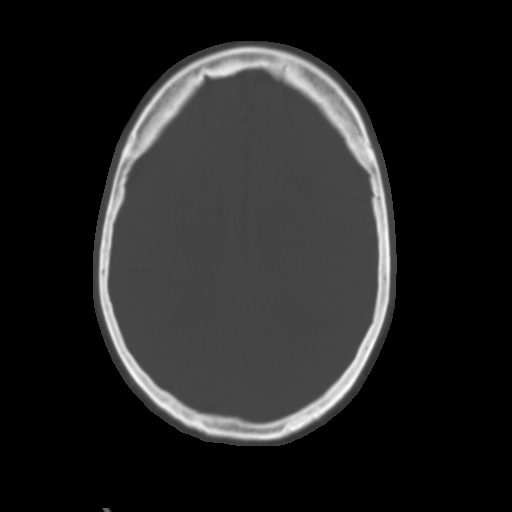
[im 25/33  brain]
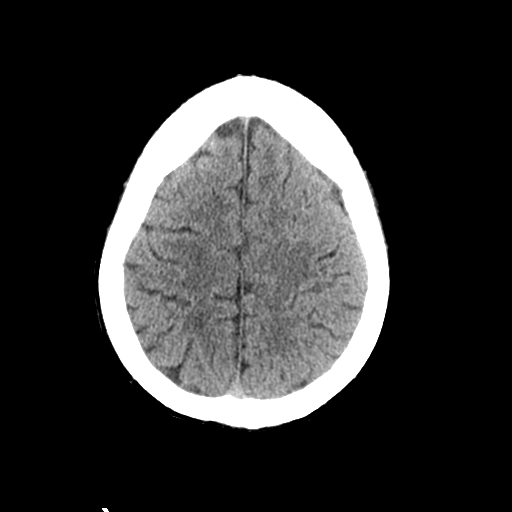
[im 29/33  brain]
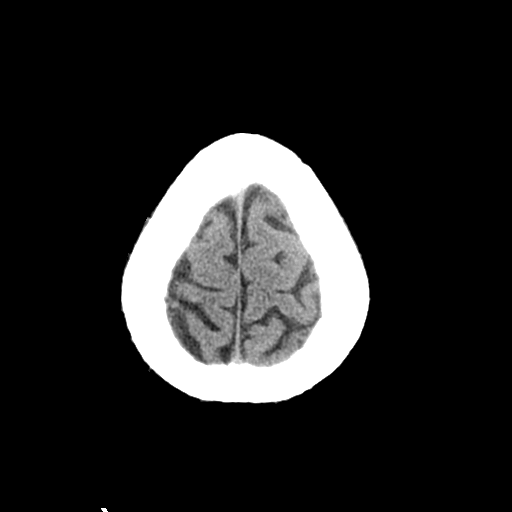

[Series 4: coronal soft tissue · coronal · 0.35mm/px · 3 of 75 slices shown]
[im 25/75  brain]
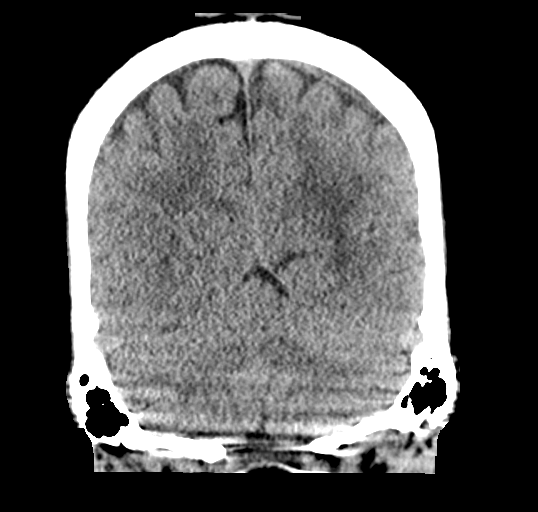
[im 33/75  brain]
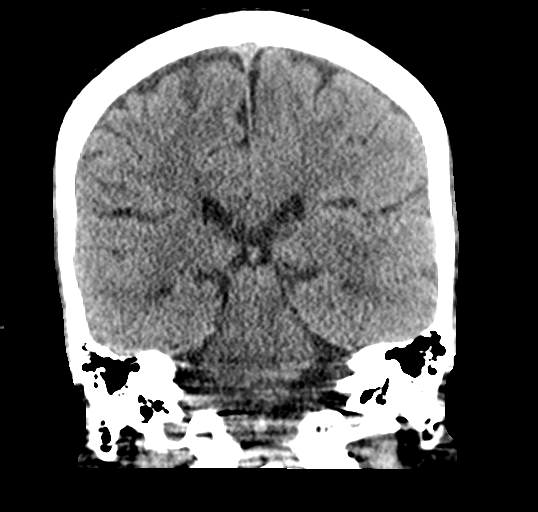
[im 42/75  brain]
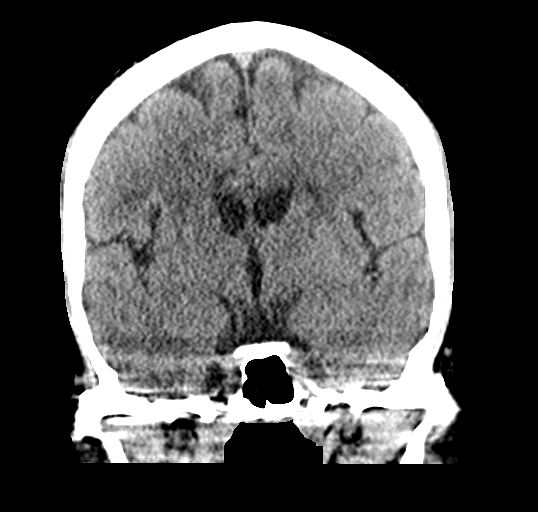

[Series 5: sagittal soft tissue · sagittal · 0.32mm/px · 3 of 58 slices shown]
[im 20/58  brain]
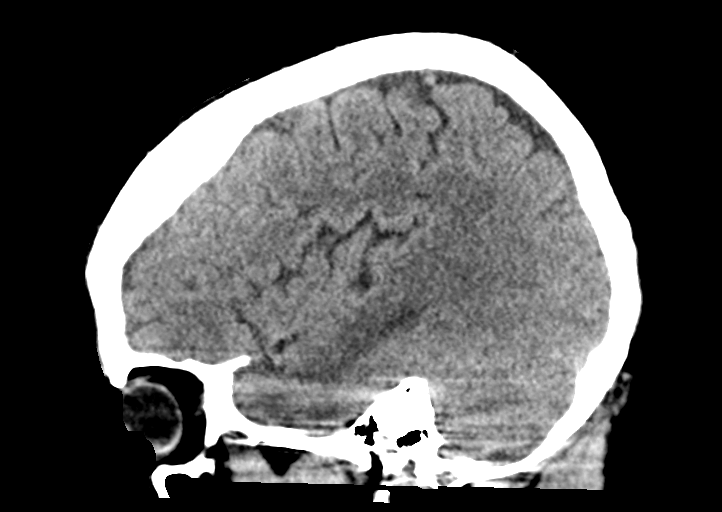
[im 29/58  brain]
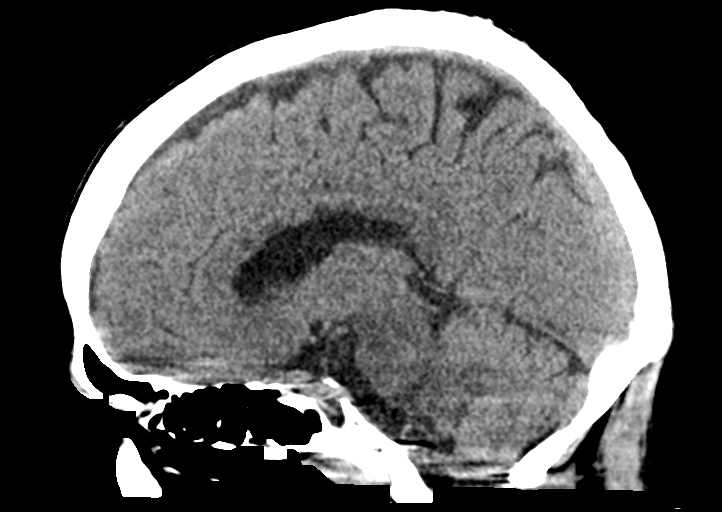
[im 39/58  brain]
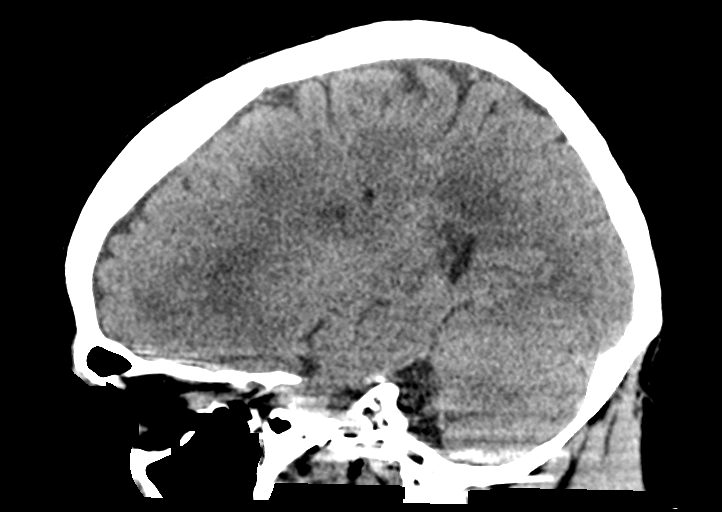

[16 of 47 positions shown; findings below may reference images not displayed]

FINDINGS: Brain: No evidence of acute infarction, hemorrhage, hydrocephalus,
extra-axial collection or mass lesion/mass effect. Periventricular
white matter low attenuation as can be seen with microvascular
disease which is greater than expected for the patient's age.
Bilateral basal ganglia lacunar infarcts. Small lacunar infarct in
the left thalamus.

Vascular: No hyperdense vessel or unexpected calcification.

Skull: No osseous abnormality.

Sinuses/Orbits: Visualized paranasal sinuses are clear. Visualized
mastoid sinuses are clear. Visualized orbits demonstrate no focal
abnormality.

Other: None
IMPRESSION: 1. No acute intracranial pathology.
2. Periventricular white matter low attenuation as can be seen with
microvascular disease which is greater than expected for the
patient's age. Bilateral basal ganglia lacunar infarcts. Small
lacunar infarct in the left thalamus.
